# Patient Record
Sex: Female | Born: 1965 | Race: White | Hispanic: No | Marital: Married | Smoking: Never smoker
Health system: Southern US, Community
[De-identification: ages and names within clinical notes are randomized; demographics above are authoritative.]

## PROBLEM LIST (undated history)

## (undated) HISTORY — PX: SHOULDER SURGERY: SHX246

---

## 2004-06-12 ENCOUNTER — Other Ambulatory Visit: Admission: RE | Admit: 2004-06-12 | Discharge: 2004-06-12 | Payer: Self-pay | Admitting: Gynecology

## 2005-06-23 ENCOUNTER — Other Ambulatory Visit: Admission: RE | Admit: 2005-06-23 | Discharge: 2005-06-23 | Payer: Self-pay | Admitting: Gynecology

## 2006-07-14 ENCOUNTER — Ambulatory Visit (HOSPITAL_COMMUNITY): Admission: RE | Admit: 2006-07-14 | Discharge: 2006-07-14 | Payer: Self-pay | Admitting: Gynecology

## 2006-07-16 ENCOUNTER — Other Ambulatory Visit: Admission: RE | Admit: 2006-07-16 | Discharge: 2006-07-16 | Payer: Self-pay | Admitting: Gynecology

## 2015-11-02 ENCOUNTER — Other Ambulatory Visit: Payer: Self-pay | Admitting: Obstetrics and Gynecology

## 2015-11-02 DIAGNOSIS — Z1231 Encounter for screening mammogram for malignant neoplasm of breast: Secondary | ICD-10-CM

## 2015-11-26 ENCOUNTER — Ambulatory Visit
Admission: RE | Admit: 2015-11-26 | Discharge: 2015-11-26 | Disposition: A | Discharge status: Home / Self Care | Payer: BLUE CROSS/BLUE SHIELD | Source: Ambulatory Visit | Attending: Obstetrics and Gynecology | Admitting: Obstetrics and Gynecology

## 2015-11-26 ENCOUNTER — Other Ambulatory Visit: Payer: Self-pay | Admitting: Obstetrics and Gynecology

## 2015-11-26 DIAGNOSIS — Z1231 Encounter for screening mammogram for malignant neoplasm of breast: Secondary | ICD-10-CM | POA: Diagnosis not present

## 2016-11-03 ENCOUNTER — Other Ambulatory Visit: Payer: Self-pay | Admitting: Obstetrics and Gynecology

## 2016-11-03 DIAGNOSIS — Z1239 Encounter for other screening for malignant neoplasm of breast: Secondary | ICD-10-CM

## 2017-11-02 ENCOUNTER — Other Ambulatory Visit: Payer: Self-pay | Admitting: Internal Medicine

## 2017-11-02 ENCOUNTER — Ambulatory Visit
Admission: RE | Admit: 2017-11-02 | Discharge: 2017-11-02 | Disposition: A | Discharge status: Home / Self Care | Payer: BLUE CROSS/BLUE SHIELD | Source: Ambulatory Visit | Attending: Internal Medicine | Admitting: Internal Medicine

## 2017-11-02 DIAGNOSIS — Z1231 Encounter for screening mammogram for malignant neoplasm of breast: Secondary | ICD-10-CM | POA: Diagnosis not present

## 2018-10-26 ENCOUNTER — Other Ambulatory Visit: Payer: Self-pay | Admitting: Physician Assistant

## 2018-10-26 DIAGNOSIS — Z1231 Encounter for screening mammogram for malignant neoplasm of breast: Secondary | ICD-10-CM

## 2018-12-01 ENCOUNTER — Ambulatory Visit
Admission: RE | Admit: 2018-12-01 | Discharge: 2018-12-01 | Disposition: A | Discharge status: Home / Self Care | Payer: Managed Care, Other (non HMO) | Source: Ambulatory Visit | Attending: Physician Assistant | Admitting: Physician Assistant

## 2018-12-01 DIAGNOSIS — Z1231 Encounter for screening mammogram for malignant neoplasm of breast: Secondary | ICD-10-CM | POA: Diagnosis not present

## 2021-05-09 DIAGNOSIS — Z79899 Other long term (current) drug therapy: Secondary | ICD-10-CM | POA: Diagnosis not present

## 2021-05-09 DIAGNOSIS — E7800 Pure hypercholesterolemia, unspecified: Secondary | ICD-10-CM | POA: Diagnosis not present

## 2021-05-09 DIAGNOSIS — Z0000 Encounter for general adult medical examination without abnormal findings: Secondary | ICD-10-CM | POA: Diagnosis not present

## 2021-05-24 ENCOUNTER — Other Ambulatory Visit (HOSPITAL_COMMUNITY): Payer: Self-pay | Admitting: Family Medicine

## 2021-05-29 ENCOUNTER — Other Ambulatory Visit: Payer: Self-pay

## 2021-05-29 ENCOUNTER — Ambulatory Visit (HOSPITAL_COMMUNITY)
Admission: RE | Admit: 2021-05-29 | Discharge: 2021-05-29 | Disposition: A | Discharge status: Home / Self Care | Payer: BLUE CROSS/BLUE SHIELD | Source: Ambulatory Visit | Attending: Family Medicine | Admitting: Family Medicine

## 2021-05-29 ENCOUNTER — Encounter (HOSPITAL_COMMUNITY): Payer: Self-pay

## 2021-05-29 DIAGNOSIS — I700 Atherosclerosis of aorta: Secondary | ICD-10-CM | POA: Insufficient documentation

## 2021-05-29 DIAGNOSIS — J8410 Pulmonary fibrosis, unspecified: Secondary | ICD-10-CM | POA: Insufficient documentation

## 2021-05-29 DIAGNOSIS — Z136 Encounter for screening for cardiovascular disorders: Secondary | ICD-10-CM | POA: Insufficient documentation

## 2021-06-26 ENCOUNTER — Encounter: Payer: Self-pay | Admitting: Cardiovascular Disease

## 2021-06-26 ENCOUNTER — Ambulatory Visit (INDEPENDENT_AMBULATORY_CARE_PROVIDER_SITE_OTHER): Payer: BC Managed Care – PPO | Admitting: Cardiovascular Disease

## 2021-06-26 ENCOUNTER — Other Ambulatory Visit: Payer: Self-pay

## 2021-06-26 VITALS — BP 118/82 | HR 66 | Ht 61.5 in | Wt 174.6 lb

## 2021-06-26 DIAGNOSIS — R931 Abnormal findings on diagnostic imaging of heart and coronary circulation: Secondary | ICD-10-CM | POA: Diagnosis not present

## 2021-06-26 NOTE — Progress Notes (Signed)
?Cardiology Office Note:   ? ?Date:  06/27/2021  ? ?ID:  Laurie Mcclure, DOB 1965/05/23, MRN [id number] ? ?PCP:  Patrice Paradise, MD ?  ?[facility] Providers ?Cardiologist:  None    ? ?Referring MD: Shon Hale, *  ? ?Chief Complaint  ?Patient presents with  ? Hyperlipidemia  ?Laurie Mcclure is a 56 y.o. female who is being seen today for the evaluation of elevated coronary calcium score at the request of Shon Hale, *. ? ? ?History of Present Illness:   ? ?Laurie Mcclure is a 56 y.o. female with a hx of excellent health, familial hypercholesterolemia, who recently underwent a coronary calcium score that was markedly elevated (113, 95th percentile for age and gender). ? 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 ?Laurie Mcclure has no history of cardiac illness.  Laurie Mcclure is physically fit and is able to walk up steep hills without shortness of breath or dyspnea (Laurie Mcclure visited [place] in [place] last December).  Laurie Mcclure does not have orthopnea, PND, claudication, edema, focal neurological complaints, palpitations or syncope. ? ?Laurie Mcclure has never smoked.  Laurie Mcclure does not have diabetes mellitus or hypertension.  Her mother has hypercholesterolemia and Alzheimer's disease but has not had coronary complications by the age of 49. ? ?History reviewed. No pertinent past medical history. ? ?History reviewed. No pertinent surgical history. ? ?Current Medications: ?Current Meds  ?Medication Sig  ? atorvastatin (LIPITOR) 20 MG tablet Take 20 mg by mouth daily.  ? ibuprofen (ADVIL) 200 MG tablet Take 200 mg by mouth as needed.  ?  ? ?Allergies:   Patient has no known allergies.  ? ?Social History  ? ?Socioeconomic History  ? Marital status: Married  ?  Spouse name: Not on file  ? Number of children: Not on file  ? Years of education: Not on file  ? Highest education level: Not on file  ?Occupational History  ? Not on file  ?Tobacco Use  ? Smoking status: Never  ? Smokeless tobacco: Never   ?Substance and Sexual Activity  ? Alcohol use: Not on file  ? Drug use: Not on file  ? Sexual activity: Not on file  ?Other Topics Concern  ? Not on file  ?Social History Narrative  ? Not on file  ? ?Social Determinants of Health  ? ?Financial Resource Strain: Not on file  ?Food Insecurity: Not on file  ?Transportation Needs: Not on file  ?Physical Activity: Not on file  ?Stress: Not on file  ?Social Connections: Not on file  ?  ? ?Family History: ?The patient's mother has hypercholesterolemia and Alzheimer's disease ? ?ROS:   ?Please see the history of present illness.    ? All other systems reviewed and are negative. ? ?EKGs/Labs/Other Studies Reviewed:   ? ?The following studies were reviewed today: ?Coronary calcium score ? ?EKG:  EKG is  ordered today.  The ekg ordered today demonstrates normal sinus rhythm, normal tracing ? ?Recent Labs: ?No results found for requested labs within last 8760 hours.  ?05/09/2021 ?Hemoglobin 12.9, creatinine 0.82, potassium 4.4, ALT 12 ?Recent Lipid Panel ?No results found for: CHOL, TRIG, HDL, CHOLHDL, VLDL, LDLCALC, LDLDIRECT ?05/09/2021 ?Cholesterol 273, HDL 72, LDL 182, triglycerides 110 ? ?Risk Assessment/Calculations:   ?  ? ?    ? ?Physical Exam:   ? ?VS:  BP 118/82   Pulse 66   Ht 5' 1.5" (1.562 m)   Wt 174 lb 9.6  oz (79.2 kg)   SpO2 99%   BMI 32.46 kg/m?    ? ?Wt Readings from Last 3 Encounters:  ?06/26/21 174 lb 9.6 oz (79.2 kg)  ?  ? ?GEN:  Well nourished, well developed in no acute distress ?HEENT: Normal ?NECK: No JVD; No carotid bruits ?LYMPHATICS: No lymphadenopathy ?CARDIAC: RRR, no murmurs, rubs, gallops ?RESPIRATORY:  Clear to auscultation without rales, wheezing or rhonchi  ?ABDOMEN: Soft, non-tender, non-distended ?MUSCULOSKELETAL:  No edema; No deformity  ?SKIN: Warm and dry ?NEUROLOGIC:  Alert and oriented x 3 ?PSYCHIATRIC:  Normal affect  ? ?ASSESSMENT:   ? ?1. Elevated coronary artery calcium score   ? ?PLAN:   ? ?In order of problems listed  above: ? ?Elevated coronary calcium score: Recommend a plain treadmill stress test to exclude functionally significant obstructive lesions. ?Hypercholesterolemia: Strongly suspect that Laurie Mcclure has familial heterozygous hypercholesterolemia.  Laurie Mcclure has been reluctant to take a statin but based on the calcium score I think Laurie Mcclure should (her 10-year MESA risk is only 1.9% based on conventional risk factors, but increases to 4.8% when adding the calcium score).  Laurie Mcclure has already received a prescription for atorvastatin 20 mg daily from Dr. Chanetta Marshallimberlake.  I think this is a great choice and should bring her LDL cholesterol down by about 50% which will make it less than 100 (I think that is a reasonable target). ? ?   ? ?Shared Decision Making/Informed Consent ?The risks [chest pain, shortness of breath, cardiac arrhythmias, dizziness, blood pressure fluctuations, myocardial infarction, stroke/transient ischemic attack, and life-threatening complications (estimated to be 1 in 10,000)], benefits (risk stratification, diagnosing coronary artery disease, treatment guidance) and alternatives of an exercise tolerance test were discussed in detail with Laurie Mcclure and Laurie Mcclure agrees to proceed.  ? ? ?Medication Adjustments/Labs and Tests Ordered: ?Current medicines are reviewed at length with the patient today.  Concerns regarding medicines are outlined above.  ?Orders Placed This Encounter  ?Procedures  ? Exercise Tolerance Test  ? EKG 12-Lead  ? ?No orders of the defined types were placed in this encounter. ? ? ?Patient Instructions  ?Medication Instructions:  ?No changes ?*If you need a refill on your cardiac medications before your next appointment, please call your pharmacy* ? ? ?Lab Work: ?None ordered ?If you have labs (blood work) drawn today and your tests are completely normal, you will receive your results only by: ?MyChart Message (if you have MyChart) OR ?A paper copy in the mail ?If you have any lab test that is abnormal or we  need to change your treatment, we will call you to review the results. ? ? ?Testing/Procedures: ?Your physician has requested that you have an exercise tolerance test. For further information please visit [email or web address]. Please also follow instruction sheet, as given. This will take place at [address], [address]. ?Do not drink or eat foods with caffeine for 24 hours before the test. (Chocolate, coffee, tea, or energy drinks) ?If you use an inhaler, bring it with you to the test. ?Do not smoke for 4 hours before the test. ?Wear comfortable shoes and clothing. ? ?Follow-Up: ?At [facility], you and your health needs are our priority.  As part of our continuing mission to provide you with exceptional heart care, we have created designated Provider Care Teams.  These Care Teams include your primary Cardiologist (physician) and Advanced Practice Providers (APPs -  Physician Assistants and Nurse Practitioners) who all work together to provide you with the care you need, when you need  it. ? ?We recommend signing up for the patient portal called "MyChart".  Sign up information is provided on this After Visit Summary.  MyChart is used to connect with patients for Virtual Visits (Telemedicine).  Patients are able to view lab/test results, encounter notes, upcoming appointments, etc.  Non-urgent messages can be sent to your provider as well.   ?To learn more about what you can do with MyChart, go to [email or web address].   ? ?Your next appointment:   ?Follow up as needed with Dr. Royann Shivers  ? ?Signed, ?Thurmon Fair, MD  ?06/27/2021 1:15 PM    ?[facility] ? ?

## 2021-06-26 NOTE — Patient Instructions (Signed)
Medication Instructions:  No changes *If you need a refill on your cardiac medications before your next appointment, please call your pharmacy*   Lab Work: None ordered If you have labs (blood work) drawn today and your tests are completely normal, you will receive your results only by: MyChart Message (if you have MyChart) OR A paper copy in the mail If you have any lab test that is abnormal or we need to change your treatment, we will call you to review the results.   Testing/Procedures: Your physician has requested that you have an exercise tolerance test. For further information please visit [email or web address]. Please also follow instruction sheet, as given. This will take place at [address], [address]. Do not drink or eat foods with caffeine for 24 hours before the test. (Chocolate, coffee, tea, or energy drinks) If you use an inhaler, bring it with you to the test. Do not smoke for 4 hours before the test. Wear comfortable shoes and clothing.   Follow-Up: At [facility], you and your health needs are our priority.  As part of our continuing mission to provide you with exceptional heart care, we have created designated Provider Care Teams.  These Care Teams include your primary Cardiologist (physician) and Advanced Practice Providers (APPs -  Physician Assistants and Nurse Practitioners) who all work together to provide you with the care you need, when you need it.  We recommend signing up for the patient portal called "MyChart".  Sign up information is provided on this After Visit Summary.  MyChart is used to connect with patients for Virtual Visits (Telemedicine).  Patients are able to view lab/test results, encounter notes, upcoming appointments, etc.  Non-urgent messages can be sent to your provider as well.   To learn more about what you can do with MyChart, go to [email or web address].    Your next appointment:   Follow up as needed with Dr. Croitoru  

## 2021-06-27 ENCOUNTER — Encounter: Payer: Self-pay | Admitting: Cardiovascular Disease

## 2021-07-16 ENCOUNTER — Encounter (HOSPITAL_COMMUNITY): Payer: Managed Care, Other (non HMO)

## 2021-07-24 ENCOUNTER — Telehealth (HOSPITAL_COMMUNITY): Payer: Self-pay | Admitting: *Deleted

## 2021-07-24 NOTE — Telephone Encounter (Signed)
Close encounter 

## 2021-07-25 ENCOUNTER — Ambulatory Visit (HOSPITAL_COMMUNITY)
Admission: RE | Admit: 2021-07-25 | Discharge: 2021-07-25 | Disposition: A | Discharge status: Home / Self Care | Payer: BC Managed Care – PPO | Source: Ambulatory Visit | Attending: Cardiology | Admitting: Cardiology

## 2021-07-25 DIAGNOSIS — R931 Abnormal findings on diagnostic imaging of heart and coronary circulation: Secondary | ICD-10-CM | POA: Insufficient documentation

## 2021-07-25 LAB — EXERCISE TOLERANCE TEST
Angina Index: 0
Base ST Depression (mm): 0 mm
Duke Treadmill Score: 9
Estimated workload: 10.7
Exercise duration (min): 9 min
Exercise duration (sec): 23 s
MPHR: 165 {beats}/min
Peak HR: 169 {beats}/min
Percent HR: 102 %
Rest HR: 68 {beats}/min
ST Depression (mm): 0 mm

## 2021-08-14 DIAGNOSIS — M791 Myalgia, unspecified site: Secondary | ICD-10-CM | POA: Diagnosis not present

## 2021-08-14 DIAGNOSIS — E7800 Pure hypercholesterolemia, unspecified: Secondary | ICD-10-CM | POA: Diagnosis not present

## 2021-08-15 ENCOUNTER — Ambulatory Visit
Admission: EM | Admit: 2021-08-15 | Discharge: 2021-08-15 | Disposition: A | Discharge status: Home / Self Care | Payer: BC Managed Care – PPO | Attending: Emergency Medicine | Admitting: Emergency Medicine

## 2021-08-15 ENCOUNTER — Encounter (HOSPITAL_BASED_OUTPATIENT_CLINIC_OR_DEPARTMENT_OTHER): Payer: Self-pay

## 2021-08-15 ENCOUNTER — Ambulatory Visit (INDEPENDENT_AMBULATORY_CARE_PROVIDER_SITE_OTHER): Payer: BC Managed Care – PPO

## 2021-08-15 ENCOUNTER — Other Ambulatory Visit: Payer: Self-pay

## 2021-08-15 ENCOUNTER — Emergency Department (HOSPITAL_BASED_OUTPATIENT_CLINIC_OR_DEPARTMENT_OTHER)
Admission: EM | Admit: 2021-08-15 | Discharge: 2021-08-15 | Disposition: A | Discharge status: Home / Self Care | Payer: BC Managed Care – PPO | Attending: Emergency Medicine | Admitting: Emergency Medicine

## 2021-08-15 DIAGNOSIS — S0121XA Laceration without foreign body of nose, initial encounter: Secondary | ICD-10-CM | POA: Diagnosis not present

## 2021-08-15 DIAGNOSIS — M79644 Pain in right finger(s): Secondary | ICD-10-CM | POA: Diagnosis not present

## 2021-08-15 DIAGNOSIS — Y9373 Activity, racquet and hand sports: Secondary | ICD-10-CM | POA: Diagnosis not present

## 2021-08-15 DIAGNOSIS — W01198A Fall on same level from slipping, tripping and stumbling with subsequent striking against other object, initial encounter: Secondary | ICD-10-CM | POA: Insufficient documentation

## 2021-08-15 DIAGNOSIS — S0181XA Laceration without foreign body of other part of head, initial encounter: Secondary | ICD-10-CM

## 2021-08-15 DIAGNOSIS — R040 Epistaxis: Secondary | ICD-10-CM | POA: Diagnosis not present

## 2021-08-15 DIAGNOSIS — S0992XA Unspecified injury of nose, initial encounter: Secondary | ICD-10-CM | POA: Diagnosis not present

## 2021-08-15 DIAGNOSIS — S80211A Abrasion, right knee, initial encounter: Secondary | ICD-10-CM

## 2021-08-15 DIAGNOSIS — S62634A Displaced fracture of distal phalanx of right ring finger, initial encounter for closed fracture: Secondary | ICD-10-CM

## 2021-08-15 DIAGNOSIS — M25561 Pain in right knee: Secondary | ICD-10-CM | POA: Diagnosis not present

## 2021-08-15 DIAGNOSIS — S8991XA Unspecified injury of right lower leg, initial encounter: Secondary | ICD-10-CM

## 2021-08-15 DIAGNOSIS — W19XXXA Unspecified fall, initial encounter: Secondary | ICD-10-CM | POA: Diagnosis not present

## 2021-08-15 MED ORDER — LIDOCAINE HCL 2 % IJ SOLN
10.0000 mL | Freq: Once | INTRAMUSCULAR | Status: AC
  Administered 2021-08-15: 200 mg via INTRADERMAL
  Filled 2021-08-15: qty 20

## 2021-08-15 NOTE — Discharge Instructions (Addendum)
[facility] for further evaluation and treatment of your right ring finger. ? ?The Steri-Strips I have applied to your face have helped approximate your wound best we can here in the clinic. ? ?Please reach out to [facility] plastic surgery specialists on [facility].  I provided you with their contact information.  It is important that the laceration on your face is sewed up by a plastic specialist for best results.  This needs to be done within the next 48 hours. ? ?Please continue to monitor your knee for stability, increased swelling, pain.  You may require imaging in the next 48 to 72 hours depending on how it is feeling.  At this time, please continue activities as tolerated and rest your knee is much as you can.  Please continue to apply ice to your knee as well. ? ?Please follow-up with your primary care provider regarding the injury to your nose.  Based on my physical exam findings, I do not believe that you have suffered any bony injuries to your face but if the bleeding from inside your nose does not resolve in the next 6 to 12 hours, I recommend that you go to the emergency room as you may require CT scan to further rule out bony injury. ? ?Thank you for visiting urgent care today. ?

## 2021-08-15 NOTE — ED Notes (Signed)
RN provided AVS using Teachback Method. Patient verbalizes understanding of Discharge Instructions. Opportunity for Questioning and Answers were provided by RN. Patient Discharged from ED ambulatory to Home with Family. ? ?

## 2021-08-15 NOTE — ED Triage Notes (Signed)
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 Patient states she fell forward and now has an abrasion to her nose, right knee, bruising to bilateral knees, and states she might have broken her right 4th finger. Patient denies SOB or vision changes at this time.  ?

## 2021-08-15 NOTE — ED Notes (Signed)
PA at the Bedside. ?
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
## 2021-08-15 NOTE — ED Provider Notes (Signed)
?[facility] EMERGENCY DEPT ?Provider Note ? ? ?CSN: [id number] ?Arrival date & time: 08/15/21  Paulo Fruit ? ?  ? ?History ? ?Chief Complaint  ?Patient presents with  ? Facial Injury  ? ? ?Laurie Mcclure is a 56 y.o. female who presents to the ED for evaluation of a yeast laceration on the nasal bridge that occurred while playing pickle ball earlier today.  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  She initially went to urgent care who referred her to [facility] for her finger and knee injury.  She also recommended that she have the laceration on her nasal bridge repaired by plastic surgeon, however patient spoke with eye doctor that she is familiar with who recommended that she get it repaired sooner rather than later.  Bleeding well controlled prior to evaluation.  Patient denies headache, confusion, nausea, vomiting. ? ?Facial Injury ? ?  ? ?Home Medications ?Prior to Admission medications   ?Medication Sig Start Date End Date Taking? Authorizing Provider  ?atorvastatin (LIPITOR) 20 MG tablet Take 20 mg by mouth daily. 06/05/21   [provider]  ?ibuprofen (ADVIL) 200 MG tablet Take 200 mg by mouth as needed.    [provider]  ?   ? ?Allergies    ?Patient has no known allergies.   ? ?Review of Systems   ?Review of Systems ? ?Physical Exam ?Updated Vital Signs ?BP (!) 156/76 (BP Location: Right Arm)   Pulse 72   Temp 98.3 ?F (36.8 ?C)   Resp 18   Ht 5\' 1"  (1.549 m)   Wt 77.1 kg   SpO2 100%   BMI 32.12 kg/m?  ?Physical Exam ?Vitals and nursing note reviewed.  ?Constitutional:   ?   General: She is not in acute distress. ?   Appearance: She is not ill-appearing.  ?HENT:  ?   Head: Atraumatic.  ?   Comments: Able to breathe through her nose ?Eyes:  ?   Conjunctiva/sclera: Conjunctivae normal.  ?   Comments: Bruising of the left medial nasal bridge  ?Cardiovascular:  ?   Rate and Rhythm: Normal rate and  regular rhythm.  ?   Pulses: Normal pulses.  ?   Heart sounds: No murmur heard. ?Pulmonary:  ?   Effort: Pulmonary effort is normal. No respiratory distress.  ?   Breath sounds: Normal breath sounds.  ?Abdominal:  ?   General: Abdomen is flat. There is no distension.  ?   Palpations: Abdomen is soft.  ?   Tenderness: There is no abdominal tenderness.  ?Musculoskeletal:     ?   General: Normal range of motion.  ?   Cervical back: Normal range of motion.  ?Skin: ?   General: Skin is warm and dry.  ?   Capillary Refill: Capillary refill takes less than 2 seconds.  ?   Comments: Small less than 1 cm U-shaped laceration of nasal bridge  ?Neurological:  ?   General: No focal deficit present.  ?   Mental Status: She is alert.  ?Psychiatric:     ?   Mood and Affect: Mood normal.  ? ? ?ED Results / Procedures / Treatments   ?Labs ?(all labs ordered are listed, but only abnormal results are displayed) ?Labs Reviewed - No data to display ? ?EKG ?None ? ?Radiology ?DG Hand Complete Right ? ?Result Date: 08/15/2021 ?CLINICAL DATA:  Fall, right fourth digit pain EXAM: RIGHT HAND -  COMPLETE 3+ VIEW COMPARISON:  None Available. FINDINGS: Displaced intra-articular corner type fracture of the dorsal right fourth distal phalanx. No other fracture or dislocation of the right hand. Joint spaces are well preserved. IMPRESSION: Displaced intra-articular corner type fracture of the dorsal right fourth distal phalanx. No other fracture or dislocation of the right hand. Electronically Signed   By: Jearld LeschAlex D Bibbey M.D.   On: 08/15/2021 15:17   ? ?Procedures ?Marland Kitchen..Laceration Repair ? ?Date/Time: 08/15/2021 10:42 PM ?Performed by: Janell Quietonklin, Tom Macpherson R, PA-C ?Authorized by: Janell Quietonklin, Charmelle Soh R, PA-C  ? ?Consent:  ?  Consent obtained:  Verbal ?  Consent given by:  Patient ?  Risks discussed:  Infection, need for additional repair, pain, poor cosmetic result and poor wound healing ?  Alternatives discussed:  No treatment and delayed treatment ?Universal  protocol:  ?  Procedure explained and questions answered to patient or proxy's satisfaction: yes   ?  Relevant documents present and verified: yes   ?  Test results available: yes   ?  Imaging studies available: yes   ?  Required blood products, implants, devices, and special equipment available: yes   ?  Site/side marked: yes   ?  Immediately prior to procedure, a time out was called: yes   ?  Patient identity confirmed:  Verbally with patient ?Anesthesia:  ?  Anesthesia method:  Local infiltration ?  Local anesthetic:  Lidocaine 2% w/o epi ?Laceration details:  ?  Location:  Face ?  Face location:  Nose ?  Length (cm):  1 ?  Depth (mm):  1 ?Exploration:  ?  Hemostasis achieved with:  Direct pressure ?  Imaging outcome: foreign body noted   ?Treatment:  ?  Area cleansed with:  Povidone-iodine ?  Amount of cleaning:  Standard ?  Irrigation solution:  Sterile saline ?  Irrigation volume:  250ml ?  Irrigation method:  Pressure wash ?  Visualized foreign bodies/material removed: no   ?  Debridement:  None ?  Undermining:  None ?  Scar revision: no   ?Skin repair:  ?  Repair method:  Sutures ?  Suture size:  6-0 ?  Suture material:  Prolene ?  Suture technique:  Simple interrupted ?  Number of sutures:  3 ?Approximation:  ?  Approximation:  Close ?Repair type:  ?  Repair type:  Simple ?Post-procedure details:  ?  Dressing:  Antibiotic ointment and adhesive bandage ?  Procedure completion:  Tolerated  ? ? ?Medications Ordered in ED ?Medications  ?lidocaine (XYLOCAINE) 2 % (with pres) injection 200 mg (200 mg Intradermal Given 08/15/21 1949)  ? ? ?ED Course/ Medical Decision Making/ A&P ?  ?                        ?Medical Decision Making ?Risk ?Prescription drug management. ? ? ?56 year old female in no acute distress, nontoxic-appearing presents to the ED for evaluation of a nasal bridge laceration.  On exam, patient has small, less than 1 cm U-shaped laceration to the nasal bridge.  [facility].  Vitals without acute abnormalities.Pressure irrigation performed. Wound explored and base of wound visualized in a bloodless field without evidence of foreign body.  Laceration occurred < 8 hours prior to repair which was well tolerated. t has  no comorbidities to effect normal wound healing. Pt discharged  without antibiotics.  Discussed suture home care with patient and answered questions. Pt to follow-up for  wound check and suture removal in 7 days; they are to return to the ED sooner for signs of infection. Pt is hemodynamically stable with no complaints prior to dc.  ? ? ?Final Clinical Impression(s) / ED Diagnoses ?Final diagnoses:  ?Facial laceration, initial encounter  ? ? ?Rx / DC Orders ?ED Discharge Orders   ? ? None  ? ?  ? ? ?  ?Janell Quiet, [place] ?08/15/21 2244 ? ?  ?Virgina Norfolk, DO ?08/15/21 2315 ? ?

## 2021-08-15 NOTE — Discharge Instructions (Addendum)
1. Medications: Tylenol or ibuprofen for pain, usual home medications 2. Treatment: ice for swelling, keep wound clean with warm soap and water and keep bandage dry, do not submerge in water for 24 hours 3. Follow Up: Please return or follow up with primary care provider in 7 days to have your stitches/staples removed or sooner if you have concerns. Return to the emergency department for increased redness, drainage of pus from the wound, or fevers/chills.   WOUND CARE  Keep area clean and dry for 24 hours. Do not remove bandage, if applied.  After 24 hours, remove bandage and wash wound gently with mild soap and warm water. Reapply a new bandage after cleaning wound, if directed.   Continue daily cleansing with soap and water until stitches/staples are removed.  Do not apply any ointments or creams to the wound while stitches/staples are in place, as this may cause delayed healing. Return if you experience any of the following signs of infection: Swelling, redness, pus drainage, streaking, fever >101.0 F  Return if you experience excessive bleeding that does not stop after 15-20 minutes of constant, firm pressure.  

## 2021-08-15 NOTE — ED Triage Notes (Signed)
Pt presents with injuries from falling forward during a pickle ball game today. Pt has already been evaluated at [facility] and ortho. Pt sent here to have sutures placed in a nose lac.  ?

## 2021-08-16 ENCOUNTER — Other Ambulatory Visit: Payer: Self-pay | Admitting: Medical

## 2021-08-16 DIAGNOSIS — M25561 Pain in right knee: Secondary | ICD-10-CM

## 2021-08-16 DIAGNOSIS — M20011 Mallet finger of right finger(s): Secondary | ICD-10-CM | POA: Diagnosis not present

## 2021-09-05 DIAGNOSIS — M79644 Pain in right finger(s): Secondary | ICD-10-CM | POA: Diagnosis not present

## 2021-09-05 DIAGNOSIS — M20011 Mallet finger of right finger(s): Secondary | ICD-10-CM | POA: Diagnosis not present

## 2021-09-26 DIAGNOSIS — M25561 Pain in right knee: Secondary | ICD-10-CM | POA: Diagnosis not present

## 2021-09-26 DIAGNOSIS — M20011 Mallet finger of right finger(s): Secondary | ICD-10-CM | POA: Diagnosis not present

## 2021-09-27 DIAGNOSIS — M20011 Mallet finger of right finger(s): Secondary | ICD-10-CM | POA: Diagnosis not present

## 2021-10-10 DIAGNOSIS — E7800 Pure hypercholesterolemia, unspecified: Secondary | ICD-10-CM | POA: Diagnosis not present

## 2021-10-16 DIAGNOSIS — M25561 Pain in right knee: Secondary | ICD-10-CM | POA: Diagnosis not present

## 2021-10-31 DIAGNOSIS — R945 Abnormal results of liver function studies: Secondary | ICD-10-CM | POA: Diagnosis not present

## 2021-11-11 DIAGNOSIS — M20011 Mallet finger of right finger(s): Secondary | ICD-10-CM | POA: Diagnosis not present

## 2021-11-28 DIAGNOSIS — M25561 Pain in right knee: Secondary | ICD-10-CM | POA: Diagnosis not present

## 2021-12-26 DIAGNOSIS — M7989 Other specified soft tissue disorders: Secondary | ICD-10-CM | POA: Diagnosis not present

## 2022-01-02 ENCOUNTER — Other Ambulatory Visit: Payer: Self-pay | Admitting: Surgery

## 2022-01-02 DIAGNOSIS — M7989 Other specified soft tissue disorders: Secondary | ICD-10-CM

## 2022-01-03 ENCOUNTER — Ambulatory Visit
Admission: RE | Admit: 2022-01-03 | Discharge: 2022-01-03 | Disposition: A | Discharge status: Home / Self Care | Payer: BC Managed Care – PPO | Source: Ambulatory Visit | Attending: Surgery | Admitting: Surgery

## 2022-01-03 DIAGNOSIS — M7989 Other specified soft tissue disorders: Secondary | ICD-10-CM

## 2022-01-03 DIAGNOSIS — R221 Localized swelling, mass and lump, neck: Secondary | ICD-10-CM | POA: Diagnosis not present

## 2022-01-07 NOTE — Progress Notes (Signed)
USN shows normal thyroid without concern.  No need for further thyroid evaluation.  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  Plan excision as outpatient surgery later this month.  Armandina Gemma, MD [facility] [place] [facility] Office: [phone]

## 2022-01-27 DIAGNOSIS — D210 Benign neoplasm of connective and other soft tissue of head, face and neck: Secondary | ICD-10-CM | POA: Diagnosis not present

## 2022-01-27 DIAGNOSIS — D170 Benign lipomatous neoplasm of skin and subcutaneous tissue of head, face and neck: Secondary | ICD-10-CM | POA: Diagnosis not present

## 2022-01-27 DIAGNOSIS — R222 Localized swelling, mass and lump, trunk: Secondary | ICD-10-CM | POA: Diagnosis not present

## 2022-05-15 DIAGNOSIS — Z79899 Other long term (current) drug therapy: Secondary | ICD-10-CM | POA: Diagnosis not present

## 2022-05-15 DIAGNOSIS — Z0000 Encounter for general adult medical examination without abnormal findings: Secondary | ICD-10-CM | POA: Diagnosis not present

## 2022-05-15 DIAGNOSIS — E7800 Pure hypercholesterolemia, unspecified: Secondary | ICD-10-CM | POA: Diagnosis not present

## 2022-05-22 ENCOUNTER — Telehealth: Payer: Self-pay | Admitting: Cardiovascular Disease

## 2022-05-22 DIAGNOSIS — R931 Abnormal findings on diagnostic imaging of heart and coronary circulation: Secondary | ICD-10-CM

## 2022-05-22 NOTE — Telephone Encounter (Signed)
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 We need updated labs, drawn off therapy (stop statin and draw fasting lipids in a month). Then can apply for one of those meds.  LDL was 182 in the past. Ca score is high for age, but only 55, so not CAD equivalent.

## 2022-05-22 NOTE — Telephone Encounter (Signed)
Returned call to patient who states that she had previously tried Atorvastatin in the past that resulted in muscle aches so her primary care switched her to Rosuvastatin now and she states that she is still having muscle aches. Patient was told by PCP to check with cards about possible medications for cholesterol. Advised patient I would forward to Dr. Loletha Grayer for him to review and advise. Medication list updated to reflect changes per patient.

## 2022-05-22 NOTE — Telephone Encounter (Signed)
Pt c/o medication issue:  1. Name of Medication:  Rosuvastatin  2. How are you currently taking this medication (dosage and times per day)?   As prescribed  3. Are you having a reaction (difficulty breathing--STAT)?   4. What is your medication issue?   Patient stated she has been taking Rosuvastatin and she would like to change this medication as she is having some side effects.  Patient would like to try something other than a statin which causes aching in her joints.

## 2022-05-23 NOTE — Telephone Encounter (Signed)
Returned call to patient and advised her of the below from Dr. Sallyanne Kuster- patient will stop her rosuvastatin and return in 1 month for repeat labs. Lab orders placed. Advised patient to call back to office with any issues, questions, or concerns. Patient verbalized understanding.

## 2022-06-18 DIAGNOSIS — R931 Abnormal findings on diagnostic imaging of heart and coronary circulation: Secondary | ICD-10-CM | POA: Diagnosis not present

## 2022-06-18 DIAGNOSIS — E782 Mixed hyperlipidemia: Secondary | ICD-10-CM | POA: Diagnosis not present

## 2022-06-18 LAB — LIPID PANEL
Chol/HDL Ratio: 4.1 ratio (ref 0.0–4.4)
Cholesterol, Total: 281 mg/dL — ABNORMAL HIGH (ref 100–199)
HDL: 68 mg/dL (ref >39)
LDL Chol Calc (NIH): 184 mg/dL — ABNORMAL HIGH (ref 0–99)
Triglycerides: 159 mg/dL — ABNORMAL HIGH (ref 0–149)
VLDL Cholesterol Cal: 29 mg/dL (ref 5–40)

## 2022-06-19 ENCOUNTER — Other Ambulatory Visit (HOSPITAL_COMMUNITY): Payer: Self-pay

## 2022-06-19 ENCOUNTER — Other Ambulatory Visit: Payer: Self-pay | Admitting: Emergency Medicine

## 2022-06-19 ENCOUNTER — Telehealth: Payer: Self-pay

## 2022-06-19 NOTE — Telephone Encounter (Signed)
Please do PA for Repatha 140 mg

## 2022-06-19 NOTE — Telephone Encounter (Signed)
Pharmacy Patient Advocate Encounter   Received notification from [facility] that prior authorization for REPATHA 140 MG/ML INJ is needed.    PA submitted on 06/19/22 Key [id number] Status is pending  Karie Soda, [place] Patient Advocate Specialist Direct Number: [phone] Fax: [phone]

## 2022-06-23 NOTE — Telephone Encounter (Signed)
Pharmacy Patient Advocate Encounter  Received notification from [facility] that the request for prior authorization for REPATHA 140 MG/ML INJ  has been denied due to .   [place], PLEASE ADVISE  Karie Soda, [place] Patient Advocate Specialist Direct Number: [phone] Fax: [phone]

## 2022-06-23 NOTE — Telephone Encounter (Signed)
Laurie Mcclure  It will be the same response.  Her insurance is saying she doesn't have enough ASCVD to meet their criteria for the medication.  Best we might be able to get is Nexletol if he wants to try that (no promises!)

## 2022-06-27 NOTE — Telephone Encounter (Signed)
Please order nexletol 180 mg daily and if approved, repeat lipid profile in 8 weeks

## 2022-06-27 NOTE — Telephone Encounter (Signed)
Patient is calling in for update. Please advise

## 2022-06-27 NOTE — Telephone Encounter (Signed)
Patient would like to try the Nexletol.  She ask to please try to get the PA and if approved she would like more information regarding the medication as she thinks she still has to take another statin with it.

## 2022-06-30 ENCOUNTER — Other Ambulatory Visit: Payer: Self-pay | Admitting: Emergency Medicine

## 2022-06-30 ENCOUNTER — Telehealth: Payer: Self-pay | Admitting: Emergency Medicine

## 2022-06-30 ENCOUNTER — Other Ambulatory Visit (HOSPITAL_COMMUNITY): Payer: Self-pay

## 2022-06-30 DIAGNOSIS — R931 Abnormal findings on diagnostic imaging of heart and coronary circulation: Secondary | ICD-10-CM

## 2022-06-30 MED ORDER — NEXLETOL 180 MG PO TABS: 180.0000 mg | ORAL_TABLET | Freq: Every day | ORAL | 3 refills | Status: DC

## 2022-06-30 NOTE — Telephone Encounter (Signed)
  PER TEST CLAIM NO PA IS NEEDED FOR NEXLETOL.   However she has a high cost. I enrolled her into a copay card that can be provided to any pharmacy (given they are able to process coupons again) it should help significantly with the copay.

## 2022-06-30 NOTE — Telephone Encounter (Signed)
Called patient- told of no PA required for NEXLETOL, but it has a high cost. Sent discount card through [employer]. Instructed patient that she needs to get lipid panel in 8 weeks, can go to any [facility].  She verbalized understanding. Prescription sent to preferred pharmacy.

## 2022-07-02 ENCOUNTER — Telehealth: Payer: Self-pay | Admitting: Cardiovascular Disease

## 2022-07-02 NOTE — Telephone Encounter (Signed)
Did she show the pharmacy to $10 coupon ("loyalty card" that we sent to her through [place]?

## 2022-07-02 NOTE — Telephone Encounter (Signed)
Patient states her medication is $700 and she can not afford that. She would like other options..   Please advise

## 2022-07-02 NOTE — Telephone Encounter (Signed)
Pt c/o medication issue:  1. Name of Medication: Bempedoic Acid (NEXLETOL) 180 MG TABS   2. How are you currently taking this medication (dosage and times per day)?    3. Are you having a reaction (difficulty breathing--STAT)? no  4. What is your medication issue? Patient is unable to afford this medication. Calling to see what other options they are. Please advise

## 2022-07-03 NOTE — Telephone Encounter (Signed)
Unfortunately, since insurance will not cover either PCSK9 inhibitors, Leqvio or Nexletol, the only remaining options are statins and ezetimibe. I know she had problems with rosuvastatin taken daily. We could try intermittent rosuvastatin dosing (10 mg twice a week to start off with) and see where that gets [place]. Another alternative would be to take pravastatin 40 mg at bedtime daily.  This is probably the least likely of the statins to cause musculoskeletal side effects. Finally, ezetimibe will give a slight reduction in LDL cholesterol, at most 15-20% but can be used alone or in combination with a low dose of a statin. To start with the simpler solution, since she already has rosuvastatin at home, I would start with the rosuvastatin 10 mg twice weekly option and check a lipid profile in 2-3 months.

## 2022-07-03 NOTE — Telephone Encounter (Signed)
Patient stated she did give them the coupon card and it didn't do a thing.

## 2022-07-04 MED ORDER — EZETIMIBE 10 MG PO TABS: 10.0000 mg | ORAL_TABLET | Freq: Every day | ORAL | 3 refills | Status: DC

## 2022-07-04 NOTE — Telephone Encounter (Signed)
Pt would like to start Ezetimibe 10mg  daily. Then if needed, add a statin? She has tried both of the statins you mentioned and both gave her problems.  Prescription sent for Ezetimibe She will come for a lipid panel in 2-3 months; orders already placed.

## 2022-09-05 DIAGNOSIS — U071 COVID-19: Secondary | ICD-10-CM | POA: Diagnosis not present

## 2022-09-05 DIAGNOSIS — J029 Acute pharyngitis, unspecified: Secondary | ICD-10-CM | POA: Diagnosis not present

## 2022-10-02 DIAGNOSIS — E7800 Pure hypercholesterolemia, unspecified: Secondary | ICD-10-CM | POA: Diagnosis not present

## 2022-10-02 DIAGNOSIS — R931 Abnormal findings on diagnostic imaging of heart and coronary circulation: Secondary | ICD-10-CM | POA: Diagnosis not present

## 2022-10-03 LAB — LIPID PANEL
Chol/HDL Ratio: 2.9 ratio (ref 0.0–4.4)
Cholesterol, Total: 211 mg/dL — ABNORMAL HIGH (ref 100–199)
HDL: 72 mg/dL (ref >39)
LDL Chol Calc (NIH): 126 mg/dL — ABNORMAL HIGH (ref 0–99)
Triglycerides: 71 mg/dL (ref 0–149)
VLDL Cholesterol Cal: 13 mg/dL (ref 5–40)

## 2022-10-06 NOTE — Progress Notes (Signed)
Please tell her that she has done an exceptional job. She has lowered her LDL with lifestyle changes much better than the average patient.  I would stay the course for now (keep up the good work and take zetia), recheck labs in 6 months, but add the LP(a) to the plain lipid profile. There are ongoing studies with the newer drugs for patients with LP(a). If the studies are positive and the drugs are approved for LP(a) reduction, we will take another crack at getting insurance coverage.

## 2022-10-26 ENCOUNTER — Emergency Department (HOSPITAL_BASED_OUTPATIENT_CLINIC_OR_DEPARTMENT_OTHER)
Admission: EM | Admit: 2022-10-26 | Discharge: 2022-10-26 | Disposition: A | Discharge status: Home / Self Care | Payer: BC Managed Care – PPO | Source: Home / Self Care | Attending: Emergency Medicine | Admitting: Emergency Medicine

## 2022-10-26 ENCOUNTER — Other Ambulatory Visit: Payer: Self-pay

## 2022-10-26 ENCOUNTER — Encounter (HOSPITAL_BASED_OUTPATIENT_CLINIC_OR_DEPARTMENT_OTHER): Payer: Self-pay

## 2022-10-26 DIAGNOSIS — N3091 Cystitis, unspecified with hematuria: Secondary | ICD-10-CM | POA: Diagnosis not present

## 2022-10-26 DIAGNOSIS — R300 Dysuria: Secondary | ICD-10-CM | POA: Diagnosis not present

## 2022-10-26 LAB — URINALYSIS, MICROSCOPIC (REFLEX): RBC / HPF: >50 RBC/hpf (ref 0–5)

## 2022-10-26 LAB — URINALYSIS, ROUTINE W REFLEX MICROSCOPIC

## 2022-10-26 MED ORDER — CEPHALEXIN 500 MG PO CAPS: 500.0000 mg | ORAL_CAPSULE | Freq: Four times a day (QID) | ORAL | 0 refills | Status: AC

## 2022-10-26 MED ORDER — PHENAZOPYRIDINE HCL 200 MG PO TABS: 200.0000 mg | ORAL_TABLET | Freq: Three times a day (TID) | ORAL | 0 refills | Status: AC

## 2022-10-26 NOTE — ED Notes (Signed)
Reviewed AVS/discharge instruction with patient. Time allotted for and all questions answered. Patient is agreeable for d/c and escorted to ed exit by staff.  

## 2022-10-26 NOTE — ED Triage Notes (Signed)
Patient here POV from Home.  Notes Dysuria, Blood in Urine, Frequency that began today. No Known Fevers. No N/V/D.  NAD Noted during Triage. A&Ox4. GCS 15. Ambulatory.

## 2022-10-26 NOTE — Discharge Instructions (Signed)
Your symptoms are suggestive of hemorrhagic cystitis.  Please take antibiotic as prescribed.  Follow-up with your doctor in 1 week for urinary recheck.  A urine culture has been sent if it grows any specific bacteria that needs changing antibiotic we will notify you with appropriate treatment.  Return to ER if your symptoms worsen or if you have other concern.

## 2022-10-26 NOTE — ED Provider Notes (Signed)
[facility] EMERGENCY DEPARTMENT AT [facility] Provider Note   CSN: [id number] Arrival date & time: 10/26/22  1535     History  Chief Complaint  Patient presents with   Dysuria    Laurie Mcclure is a 57 y.o. female.  The history is provided by the patient and medical records. No language interpreter was used.  Dysuria    57 year old female presenting to the ED with complaints of urinary discomfort.  Patient states today when she urinates she noticed quite a bit of blood coming from her urine.  She tried to capture in a cup and noticed that her urine was filled of blood.  She also endorsed burning in urination with increased frequency and urgency.  She does not endorse any fever or chills no nausea vomiting diarrhea no back pain.  She states she has not had any kind of vaginal spotting for at least 2 years.  She denies having fever, unexpected weight changes, or night sweats.    Home Medications Prior to Admission medications   Medication Sig Start Date End Date Taking? Authorizing Provider  Bempedoic Acid (NEXLETOL) 180 MG TABS Take 1 tablet (180 mg total) by mouth daily. 06/30/22   Croitoru, Mihai, MD  ezetimibe (ZETIA) 10 MG tablet Take 1 tablet (10 mg total) by mouth daily. 07/04/22 06/29/23  Croitoru, Mihai, MD  ibuprofen (ADVIL) 200 MG tablet Take 200 mg by mouth as needed.    [provider]      Allergies    Patient has no known allergies.    Review of Systems   Review of Systems  Genitourinary:  Positive for dysuria.  All other systems reviewed and are negative.   Physical Exam Updated Vital Signs BP (!) 163/85 (BP Location: Right Arm)   Pulse 79   Temp 98.4 F (36.9 C) (Oral)   Resp 18   Ht 5\' 2"  (1.575 m)   Wt 77.6 kg   SpO2 97%   BMI 31.28 kg/m  Physical Exam Vitals and nursing note reviewed.  Constitutional:      General: She is not in acute distress.    Appearance: She is well-developed.  HENT:     Head: Atraumatic.  Eyes:      Conjunctiva/sclera: Conjunctivae normal.  Cardiovascular:     Rate and Rhythm: Normal rate and regular rhythm.  Pulmonary:     Effort: Pulmonary effort is normal.  Abdominal:     Palpations: Abdomen is soft.     Tenderness: There is no abdominal tenderness.  Musculoskeletal:     Cervical back: Neck supple.  Skin:    Findings: No rash.  Neurological:     Mental Status: She is alert.  Psychiatric:        Mood and Affect: Mood normal.     ED Results / Procedures / Treatments   Labs (all labs ordered are listed, but only abnormal results are displayed) Labs Reviewed  URINALYSIS, ROUTINE W REFLEX MICROSCOPIC - Abnormal; Notable for the following components:      Result Value   Color, Urine RED (*)    APPearance CLOUDY (*)    Glucose, UA   (*)    Value: TEST NOT REPORTED DUE TO COLOR INTERFERENCE OF URINE PIGMENT   Hgb urine dipstick   (*)    Value: TEST NOT REPORTED DUE TO COLOR INTERFERENCE OF URINE PIGMENT   Bilirubin Urine   (*)    Value: TEST NOT REPORTED DUE TO COLOR INTERFERENCE OF URINE PIGMENT  Ketones, ur   (*)    Value: TEST NOT REPORTED DUE TO COLOR INTERFERENCE OF URINE PIGMENT   Protein, ur   (*)    Value: TEST NOT REPORTED DUE TO COLOR INTERFERENCE OF URINE PIGMENT   Nitrite   (*)    Value: TEST NOT REPORTED DUE TO COLOR INTERFERENCE OF URINE PIGMENT   Leukocytes,Ua   (*)    Value: TEST NOT REPORTED DUE TO COLOR INTERFERENCE OF URINE PIGMENT   All other components within normal limits  URINALYSIS, MICROSCOPIC (REFLEX) - Abnormal; Notable for the following components:   Bacteria, UA FEW (*)    All other components within normal limits    EKG None  Radiology No results found.  Procedures Pelvic exam  Date/Time: 10/26/2022 6:15 PM  Performed by: Fayrene Helper, PA-C Authorized by: Fayrene Helper, PA-C  Comments: Chaperone present during exam.  No inguinal lymphadenopathy noted, normal external genitalia.  No pain with speculum insertion.  Normal vaginal  vault free of lesion or rash.  Close cervical os no vaginal bleeding noted.  No tenderness on bimanual exam.       Medications Ordered in ED Medications - No data to display  ED Course/ Medical Decision Making/ A&P                             Medical Decision Making Amount and/or Complexity of Data Reviewed Labs: ordered.   Blood pressure (!) 163/85, pulse 79, temperature 98.4 F (36.9 C), temperature source Oral, resp. rate 18, height 5\' 2"  (1.575 m), weight 77.6 kg, SpO2 97%.  23:57 PM 57 year old female presenting to the ED with complaints of urinary discomfort.  Patient states today when she urinates she noticed quite a bit of blood coming from her urine.  She tried to capture in a cup and noticed that her urine was filled of blood.  She also endorsed burning in urination with increased frequency and urgency.  She does not endorse any fever or chills no nausea vomiting diarrhea no back pain.  She states she has not had any kind of vaginal spotting for at least 2 years.  She denies having fever, unexpected weight changes, or night sweats.  On exam this is a well-appearing female resting comfortably in bed appears to be in no acute discomfort.  Heart with normal rate and rhythm, lungs clear to auscultation bilaterally abdomen is soft and nontender  -Labs ordered, independently viewed and interpreted by me.  Labs remarkable for UA showing red color, cloudy appearasnce and >50 RBC along with 6-10 WBC and few bacteria -The patient was maintained on a cardiac monitor.  I personally viewed and interpreted the cardiac monitored which showed an underlying rhythm of: NSR -Imaging including abd/pelvis CT considered but pt without abd pain or flank pain therefore doubt emergent abdominal/pelvis pathology -This patient presents to the ED for concern of hematuria, this involves an extensive number of treatment options, and is a complaint that carries with it a high risk of complications and  morbidity.  The differential diagnosis includes hemorrhagic cystitis, UTI, kidney stone, malignancy, vaginal bleeding, runner's bladder -Co morbidities that complicate the patient evaluation includes none -Treatment includes keflex -Reevaluation of the patient after these medicines showed that the patient stayed the same -PCP office notes or outside notes reviewed -Escalation to admission/observation considered: patients feels much better, is comfortable with discharge, and will follow up with PCP -Prescription medication considered, patient comfortable with keflex and pyridium -  Social Determinant of Health considered         Final Clinical Impression(s) / ED Diagnoses Final diagnoses:  Hemorrhagic cystitis    Rx / DC Orders ED Discharge Orders          Ordered    cephALEXin (KEFLEX) 500 MG capsule  4 times daily        10/26/22 1814    phenazopyridine (PYRIDIUM) 200 MG tablet  3 times daily        10/26/22 1818              Fayrene Helper, PA-C 10/26/22 1818    Horton, Clabe Seal, DO 10/26/22 2333

## 2022-10-31 ENCOUNTER — Encounter: Payer: Self-pay | Admitting: Family Medicine

## 2022-10-31 DIAGNOSIS — R319 Hematuria, unspecified: Secondary | ICD-10-CM | POA: Diagnosis not present

## 2022-11-04 ENCOUNTER — Other Ambulatory Visit: Payer: Self-pay | Admitting: Family Medicine

## 2022-11-04 DIAGNOSIS — R319 Hematuria, unspecified: Secondary | ICD-10-CM

## 2022-11-14 ENCOUNTER — Other Ambulatory Visit: Payer: BC Managed Care – PPO

## 2022-11-19 ENCOUNTER — Ambulatory Visit: Admission: RE | Admit: 2022-11-19 | Payer: BC Managed Care – PPO | Source: Ambulatory Visit

## 2022-11-19 ENCOUNTER — Other Ambulatory Visit: Payer: Self-pay | Admitting: Family Medicine

## 2022-11-19 DIAGNOSIS — R310 Gross hematuria: Secondary | ICD-10-CM | POA: Diagnosis not present

## 2022-11-19 DIAGNOSIS — R319 Hematuria, unspecified: Secondary | ICD-10-CM

## 2022-11-19 DIAGNOSIS — I700 Atherosclerosis of aorta: Secondary | ICD-10-CM | POA: Diagnosis not present

## 2022-12-04 ENCOUNTER — Other Ambulatory Visit: Payer: Self-pay | Admitting: Family Medicine

## 2022-12-04 DIAGNOSIS — Z1231 Encounter for screening mammogram for malignant neoplasm of breast: Secondary | ICD-10-CM

## 2022-12-17 NOTE — Progress Notes (Signed)
 Subjective:   Chief complaint: This patient is seen in consultation for Gross Hematuria   Patient ID: Laurie Mcclure is a 57 y.o. female    Thank you for allowing us  to see Laurie Mcclure in consultation at the [facility].  I have personally reviewed the medical record, including office notes, laboratory results, imaging studies, and I have personally interviewed and examined the patient with the pertinent findings detailed below for our records.  History of Present Illness   Laurie Mcclure is a 57 y.o. female with a PMH that includes hypercholesterolemia  Today She reports chief complaint of Gross hematuria. She reports fullness of the bladder since she had recent episode gross hematuria.   The hematuria began in July and is described as one time episode. Pt denies dysuria. She urinates at normal frequency per day. She denies urgency. She denies the need to strain to void. She reports sensations of incomplete emptying. She denies abdominal pain. She denies flank pain. She denies fevers.   She denies prior history of gross hematuria.  She denies history of kidney stones.  She denies history of pyelonephritis.  She denies history of recent or recurrent UTI.  She denies history of GU malignancy or pelvic radiation.  She denies history of autoimmune disease.  She denies history of smoking  She denies known occupational risks.  She denies recent vigorous exercise which they think may be contributory to hematuria.  She denies any recent trauma or prolonged pressure to the perineal area.  She denies recent illness.  She denies taking anticoagulants.     Prior Imaging: CT Renal Stone Study on 11/19/22 at [facility]: FINDINGS:  Lower chest: No acute abnormality.   Hepatobiliary: No solid liver abnormality is seen. No gallstones,  gallbladder wall thickening, or biliary dilatation.   Pancreas: Unremarkable. No pancreatic ductal dilatation or  surrounding  inflammatory changes.   Spleen: Normal in size without significant abnormality.   Adrenals/Urinary Tract: Adrenal glands are unremarkable. Kidneys are  normal, without renal calculi, solid lesion, or hydronephrosis.  Bladder is unremarkable.   Stomach/Bowel: Stomach is within normal limits. Appendix appears  normal. No evidence of bowel wall thickening, distention, or  inflammatory changes.   Vascular/Lymphatic: Scattered aortic atherosclerosis. No enlarged  abdominal or pelvic lymph nodes.   Reproductive: No mass or other significant abnormality.   Other: No abdominal wall hernia or abnormality. No ascites.   Musculoskeletal: No acute or significant osseous findings.   IMPRESSION:  No non-contrast CT findings of the abdomen or pelvis to explain  hematuria. No evidence of urinary tract calculus or hydronephrosis.     UA today showed small blood. PVR: 5 mL    Past Medical History:  Diagnosis Date  . Acute nonintractable headache 03/05/2016  . 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 Been taking medication for the last two years.    . Pure hypercholesterolemia 11/27/2015   Past Surgical History:  Procedure Laterality Date  . SOFT TISSUE CYST EXCISION      Current Outpatient Medications:  .  Zetia  10 mg tablet, Take 1 tablet by mouth daily., Disp: , Rfl:  .  ibuprofen (MOTRIN) 200 mg tablet, Take 200 mg by mouth every 6 (six) hours as needed. (Patient not taking: Reported on 12/19/2022), Disp: , Rfl:    Review of Systems:   ROS was performed with pertinent positives/negatives noted in the HPI. The remainder of the ROS are negative.  Family History  Family History  Problem Relation Name Age of Onset  . Hyperlipidemia Mother         Objective:   Physical Exam  BP 136/75   Pulse 63   Resp 16   Ht 1.549 m (5' 1)   Wt 75.3 kg (166 lb)   SpO2 100%   BMI 31.37 kg/m   Body mass index is 31.37  kg/m.  Constitutional:  No acute distress Head: normocephalic, atraumatic Eyes: EOMI, sclera anicteric Ears/Nose/Mouth/Throat: No obvious drainage per ears, wearing mask Cardiovascular: Pulse regular Respiratory: No audible wheezing/stridor; respirations do not appear labored Gastrointestinal: Abdomen soft, non-tender, not distended Musculoskeletal: Normal ROM of UEs Skin: No obvious rashes/open sores Neurologic: Alert and oriented x 3 Psychiatric: Answered questions appropriately w/normal affect Hematologic/Lymphatic/Immunologic: No obvious bruises or sites of spontaneous bleeding Genitourinary: No CVA tenderness   Data Review as noted.  Results for orders placed or performed in visit on 12/19/22  Urinalysis without Microscopic  Result Value Ref Range   Color, Urine Yellow Yellow   Clarity, Urine Clear Clear   Specific Gravity, Urine 1.025 1.002 - 1.030   pH, Urine 6.0 5.0 - 8.0   Protein, Urine Negative Negative, Trace mg/dL   Glucose, Urine Negative Negative mg/dL   Ketones, Urine Negative Negative mg/dL   Bilirubin, Urine Negative Negative   Blood, Urine Small (A) Negative   Nitrite, Urine Negative Negative   Leukocyte Esterase, Urine Negative Negative   Urobilinogen, Urine 0.2 <2.0 mg/dL     Assessment:   Gross Hematuria For management of gross hematuria, we discussed possible etiologies including but not limited to: vigorous exercise, sexual activity, stone, trauma, blood thinner use, infection, kidney function, radiation cystitis, malignancy. We discussed the importance of work-up including assessing the lower GU tract cystoscopy. She had a CT scan done recently that was unremarkable. We will also check CMP, CBC, and voided cytology. Pt decided to pursue this work-up and follow-up afterward to discuss the results and formulate a treatment plan based on the findings. We discussed the risk for clot retention and pt was advised to increase fluid intake to thin out clots.  Pt was advised to go to the ER if they become unable to urinate due to clot retention. All questions were answered.     Patient Active Problem List  Diagnosis  (none) - all problems resolved or deleted     Plan:    Urine culture sent Voided cytology sent Schedule cystoscopy with Dr. Tommi  CMP and CBC sent    It has been explained that the patient is to follow regularly with their PCP in addition to all other providers involved in their care and to follow instructions provided by these respective offices. However, patient will contact the clinic if any urologic-pertaining questions, concerns, new symptoms or problems arise in the interim period.   Patient is agreeable to plan and all questions answered. After visit summary given to the patient or is available online via [facility] portal.   1. Hematuria, unspecified type  Urinalysis without Microscopic   Urinalysis without Microscopic   Surgical Urine Culture, Sterile Collection   Urinalysis, Microscopic Only   Surgical Urine Culture, Sterile Collection   Urinalysis, Microscopic Only

## 2022-12-19 DIAGNOSIS — R310 Gross hematuria: Secondary | ICD-10-CM | POA: Diagnosis not present

## 2022-12-19 DIAGNOSIS — R3914 Feeling of incomplete bladder emptying: Secondary | ICD-10-CM | POA: Diagnosis not present

## 2022-12-19 DIAGNOSIS — R319 Hematuria, unspecified: Secondary | ICD-10-CM | POA: Diagnosis not present

## 2022-12-23 ENCOUNTER — Ambulatory Visit
Admission: RE | Admit: 2022-12-23 | Discharge: 2022-12-23 | Disposition: A | Discharge status: Home / Self Care | Payer: BC Managed Care – PPO | Source: Ambulatory Visit | Attending: Family Medicine

## 2022-12-23 DIAGNOSIS — Z1231 Encounter for screening mammogram for malignant neoplasm of breast: Secondary | ICD-10-CM | POA: Diagnosis not present

## 2023-02-03 DIAGNOSIS — R3914 Feeling of incomplete bladder emptying: Secondary | ICD-10-CM | POA: Diagnosis not present

## 2023-02-03 DIAGNOSIS — R310 Gross hematuria: Secondary | ICD-10-CM | POA: Diagnosis not present

## 2023-02-03 NOTE — Progress Notes (Signed)
 Subjective:  12/19/2022: Our NP:  Chief complaint: This patient is seen in consultation for Gross Hematuria   Patient ID: Laurie Mcclure is a 57 y.o. female    rds.  History of Present Illness   Laurie Mcclure is a 57 y.o. female with a PMH that includes hypercholesterolemia  Today She reports chief complaint of Gross hematuria. She reports fullness of the bladder since she had recent episode gross hematuria.   The hematuria began in July and is described as one time episode. Pt denies dysuria. She urinates at normal frequency per day. She denies urgency. She denies the need to strain to void. She reports sensations of incomplete emptying. She denies abdominal pain. She denies flank pain. She denies fevers.   She denies prior history of gross hematuria.  She denies history of kidney stones.  She denies history of pyelonephritis.  She denies history of recent or recurrent UTI.  She denies history of GU malignancy or pelvic radiation.  She denies history of autoimmune disease.  She denies history of smoking  She denies known occupational risks.  She denies recent vigorous exercise which they think may be contributory to hematuria.  She denies any recent trauma or prolonged pressure to the perineal area.  She denies recent illness.  She denies taking anticoagulants.     Prior Imaging: CT Renal Stone Study on 11/19/22 at [facility]: FINDINGS:  Lower chest: No acute abnormality.   Hepatobiliary: No solid liver abnormality is seen. No gallstones,  gallbladder wall thickening, or biliary dilatation.   Pancreas: Unremarkable. No pancreatic ductal dilatation or  surrounding inflammatory changes.   Spleen: Normal in size without significant abnormality.   Adrenals/Urinary Tract: Adrenal glands are unremarkable. Kidneys are  normal, without renal calculi, solid lesion, or hydronephrosis.  Bladder is unremarkable.   Stomach/Bowel: Stomach is within normal limits. Appendix appears   normal. No evidence of bowel wall thickening, distention, or  inflammatory changes.   Vascular/Lymphatic: Scattered aortic atherosclerosis. No enlarged  abdominal or pelvic lymph nodes.   Reproductive: No mass or other significant abnormality.   Other: No abdominal wall hernia or abnormality. No ascites.   Musculoskeletal: No acute or significant osseous findings.   IMPRESSION:  No non-contrast CT findings of the abdomen or pelvis to explain  hematuria. No evidence of urinary tract calculus or hydronephrosis.     UA today showed small blood. PVR: 5 mL    Past Medical History:  Diagnosis Date  . Acute nonintractable headache 03/05/2016  . 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 Been taking medication for the last two years.    . Pure hypercholesterolemia 11/27/2015   Past Surgical History:  Procedure Laterality Date  . SOFT TISSUE CYST EXCISION      Current Outpatient Medications:  .  Zetia  10 mg tablet, Take 1 tablet by mouth daily., Disp: , Rfl:  No current facility-administered medications for this visit.   Review of Systems:   ROS was performed with pertinent positives/negatives noted in the HPI. The remainder of the ROS are negative.  Family History  Family History  Problem Relation Name Age of Onset  . Hyperlipidemia Mother         Objective:   Physical Exam  BP 148/72   Pulse 60   Temp 97.5 F (36.4 C) (Temporal)   Resp 16   Ht 1.562 m (5' 1.5)   Wt 75.3 kg (166 lb)   SpO2 99%   BMI 30.86 kg/m  Body mass index is 30.86 kg/m.  Constitutional:  No acute distress Head: normocephalic, atraumatic Eyes: EOMI, sclera anicteric Ears/Nose/Mouth/Throat: No obvious drainage per ears, wearing mask Cardiovascular: Pulse regular Respiratory: No audible wheezing/stridor; respirations do not appear labored Gastrointestinal: Abdomen soft, non-tender, not distended Musculoskeletal: Normal ROM of  UEs Skin: No obvious rashes/open sores Neurologic: Alert and oriented x 3 Psychiatric: Answered questions appropriately w/normal affect Hematologic/Lymphatic/Immunologic: No obvious bruises or sites of spontaneous bleeding Genitourinary: No CVA tenderness   Data Review as noted.  Results for orders placed or performed in visit on 12/19/22  Urinalysis without Microscopic   Collection Time: 12/19/22  1:55 PM  Result Value Ref Range   Color, Urine Yellow Yellow   Clarity, Urine Clear Clear   Specific Gravity, Urine 1.025 1.002 - 1.030   pH, Urine 6.0 5.0 - 8.0   Protein, Urine Negative Negative, Trace mg/dL   Glucose, Urine Negative Negative mg/dL   Ketones, Urine Negative Negative mg/dL   Bilirubin, Urine Negative Negative   Blood, Urine Small (A) Negative   Nitrite, Urine Negative Negative   Leukocyte Esterase, Urine Negative Negative   Urobilinogen, Urine 0.2 <2.0 mg/dL  Surgical Urine Culture, Sterile Collection   Collection Time: 12/19/22  2:02 PM   Specimen: Clean Catch; Urine  Result Value Ref Range   Urine Culture,Surgical No uropathogens isolated.   Urinalysis, Microscopic Only   Collection Time: 12/19/22  2:02 PM  Result Value Ref Range   WBC, Urine None Seen <3 /HPF   RBC, Urine 0-2 <3 /HPF   Bacteria, Urine 1-5 (A) None Seen /HPF  Non-Gynecologic Cytology   Collection Time: 12/19/22  2:33 PM  Result Value Ref Range   Case Report      Non-Gynecologic Cytology Report                   Case: [id number]                              Authorizing Provider:  Oluwatoyin Alaba Fadeyi,   Collected:           12/19/2022 1433                                    DNP                                                                         Ordering Location:     [facility]  Received:            12/19/2022 1433                                    [facility]  - Urology  [place]                                                                     Pathologist:           Jon Ronnald Nephew, MD                                                    Specimen:    Voided                                                                                   Final Diagnosis      A. URINE, VOIDED (THINPREP): Negative for high grade urothelial carcinoma.    Clinical Information      Gross hematuria    Gross Description      A. Urine: Voided 60 cc of yellow fluid including ThinPrep CytoLytT.  No sediment. 1 liquid based slide prepared.      Attestation Statement      I verify that I have personally reviewed all relevant slides/materials for this case and rendered or confirmed the diagnosis.   Comprehensive Metabolic Panel   Collection Time: 12/19/22  2:44 PM  Result Value Ref Range   Sodium 139 136 - 145 mmol/L   Potassium 4.5 3.5 - 5.1 mmol/L   Chloride 101 98 - 107 mmol/L   CO2 28 21 - 31 mmol/L   Anion Gap 10 6 - 14 mmol/L   Glucose, Random 83 70 - 99 mg/dL   Blood Urea Nitrogen (BUN) 22 7 - 25 mg/dL   Creatinine 9.15 9.39 - 1.20 mg/dL   eGFR 81 >40 fO/fpw/8.26f7   Albumin 4.7 3.5 - 5.7 g/dL   Total Protein 7.4 6.4 - 8.9 g/dL   Bilirubin, Total 0.5 0.3 - 1.0 mg/dL   Alkaline Phosphatase (ALP) 62 34 - 104 U/L   Aspartate Aminotransferase (AST) 15 13 - 39 U/L   Alanine Aminotransferase (ALT) 17 7 - 52 U/L   Calcium 9.9 8.6 - 10.3 mg/dL   BUN/Creatinine Ratio    CBC with Differential   Collection Time: 12/19/22  2:44 PM  Result Value Ref Range   WBC 8.30 4.40 - 11.00 10*3/uL   RBC 4.43 4.10 - 5.10 10*6/uL   Hemoglobin 13.2 12.3 - 15.3 g/dL   Hematocrit 61.3 64.0 - 44.6 %   Mean Corpuscular Volume (MCV) 87.1 80.0 - 96.0 fL   Mean Corpuscular Hemoglobin (MCH) 29.8 27.5 - 33.2 pg   Mean Corpuscular Hemoglobin Conc (MCHC) 34.2 33.0 - 37.0 g/dL   Red Cell Distribution Width (RDW) 15.3 12.3 - 17.0 %   Platelet Count (PLT) 254 150 - 450 10*3/uL   Mean Platelet Volume (MPV) 9.5  6.8 - 10.2 fL   Neutrophils % 64 %   Lymphocytes %  25 %   Monocytes % 9 %   Eosinophils % 2 %   Basophils % 0 %   nRBC % 0 %   Neutrophils Absolute 5.30 1.80 - 7.80 10*3/uL   Lymphocytes # 2.10 1.00 - 4.80 10*3/uL   Monocytes # 0.70 0.00 - 0.80 10*3/uL   Eosinophils # 0.10 0.00 - 0.50 10*3/uL   Basophils # 0.00 0.00 - 0.20 10*3/uL   nRBC Absolute 0.00 <=0.00 10*3/uL     Assessment:   Gross Hematuria For management of gross hematuria, we discussed possible etiologies including but not limited to: vigorous exercise, sexual activity, stone, trauma, blood thinner use, infection, kidney function, radiation cystitis, malignancy. We discussed the importance of work-up including assessing the lower GU tract cystoscopy. She had a CT scan done recently that was unremarkable. We will also check CMP, CBC, and voided cytology. Pt decided to pursue this work-up and follow-up afterward to discuss the results and formulate a treatment plan based on the findings. We discussed the risk for clot retention and pt was advised to increase fluid intake to thin out clots. Pt was advised to go to the ER if they become unable to urinate due to clot retention. All questions were answered.     Patient Active Problem List  Diagnosis  (none) - all problems resolved or deleted     Plan:    Urine culture sent Voided cytology sent Schedule cystoscopy with Dr. Tommi  CMP and CBC sent      1. Gross hematuria  ciprofloxacin (CIPRO) tablet 500 mg      02/03/2023: Cystoscopy Verbal consent obtained The patient's perineum was prepped with betadine and draped in the usual sterile fashion, with patient in the lithotomy position. The patient was given a prophylactic treatment before the procedure. 2% Xylocaine  jelly was instilled in the urethra. A cystoscopy was then carried out using a flexible cystoscope, and the urethra was inspected using direct vision. The bladder was entered and pandescoped. The scope was  removed without incident.   Findings Bladder: Bladder is normal with no lesions, tumors or diverticuli.  Bladder Comments. . No mass. No mucosal lesion Ureteral orifices: Both ureteral orifices were in their normal anatomic position and appearance.    Urethra: Urethra is normal with no evidence of stricture.  General Findings Comments:   Complete normal bladder. Patient with history of 1 day of gross hematuria with pain more than 3 months ago.  She has gone to [facility] and a CT without contrast has been done that was normal.  CT was 1 months after episodes.  She never had hematuria prior.  She never had hematuria after 1 day.  She has never had a stone.  Pain has been very significant at that time.  She has been given an antibiotic but urine has not been sent for culture.  No symptoms at this point. Urine cytology is negative   Plan: CT urogram If CT urogram will be normal just 1 visit in 1 year with renal ultrasound If she develops gross hematuria again will come back earlier

## 2023-02-03 NOTE — Progress Notes (Signed)
  Diagnostic Cystoscopy  Performed by: Rhae Loges, MD Authorized by: Rhae Loges, MD

## 2023-05-12 ENCOUNTER — Other Ambulatory Visit: Payer: Self-pay | Admitting: Cardiovascular Disease

## 2023-05-18 ENCOUNTER — Other Ambulatory Visit (HOSPITAL_COMMUNITY): Payer: Self-pay

## 2023-05-18 MED FILL — Ezetimibe Tab 10 MG: ORAL | 90 days supply | Qty: 90 | Fill #0 | Status: AC

## 2023-06-01 ENCOUNTER — Other Ambulatory Visit: Payer: Self-pay | Admitting: Family Medicine

## 2023-06-01 ENCOUNTER — Other Ambulatory Visit (HOSPITAL_COMMUNITY)
Admission: RE | Admit: 2023-06-01 | Discharge: 2023-06-01 | Disposition: A | Discharge status: Home / Self Care | Payer: 59 | Source: Ambulatory Visit | Attending: Family Medicine | Admitting: Family Medicine

## 2023-06-01 DIAGNOSIS — E7800 Pure hypercholesterolemia, unspecified: Secondary | ICD-10-CM | POA: Diagnosis not present

## 2023-06-01 DIAGNOSIS — Z124 Encounter for screening for malignant neoplasm of cervix: Secondary | ICD-10-CM | POA: Diagnosis not present

## 2023-06-01 DIAGNOSIS — Z0000 Encounter for general adult medical examination without abnormal findings: Secondary | ICD-10-CM | POA: Diagnosis not present

## 2023-06-01 DIAGNOSIS — Z01411 Encounter for gynecological examination (general) (routine) with abnormal findings: Secondary | ICD-10-CM | POA: Diagnosis not present

## 2023-06-01 DIAGNOSIS — Z79899 Other long term (current) drug therapy: Secondary | ICD-10-CM | POA: Diagnosis not present

## 2023-06-02 LAB — CYTOLOGY - PAP
Comment: NEGATIVE
Diagnosis: NEGATIVE
High risk HPV: NEGATIVE

## 2023-06-08 IMAGING — CT CT CARDIAC CORONARY ARTERY CALCIUM SCORE
2 of 3 series · 10 of 20 positions shown, 12 images · non-contrast
Comparison: None.
COMPARISON: None.

Addendum:
EXAM:
OVER-READ INTERPRETATION  CT CHEST

The following report is an over-read performed by radiologist Dr.
Sumitra Odriscoll [REDACTED] on 05/29/2021. This over-read
does not include interpretation of cardiac or coronary anatomy or
pathology. The coronary calcium score interpretation by the
cardiologist is attached.
CLINICAL DATA: Cardiovascular Disease Risk stratification
Coronary Calcium Score
TECHNIQUE: A gated, non-contrast computed tomography scan of the heart was
performed using 3mm slice thickness. Axial images were analyzed on a
dedicated workstation. Calcium scoring of the coronary arteries was
performed using the Agatston method.

[Series 3: 2 soft full fov · axial · 0.74mm/px · z∈[-233,-146]mm · 5 of 45 slices shown, 7 images]
[im 8/45  vessel]
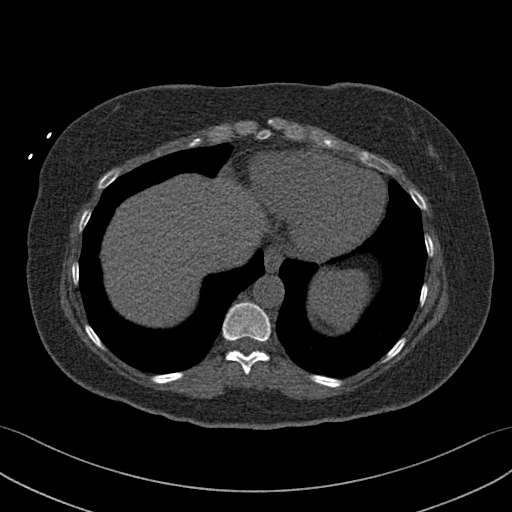
[im 8/45  lung]
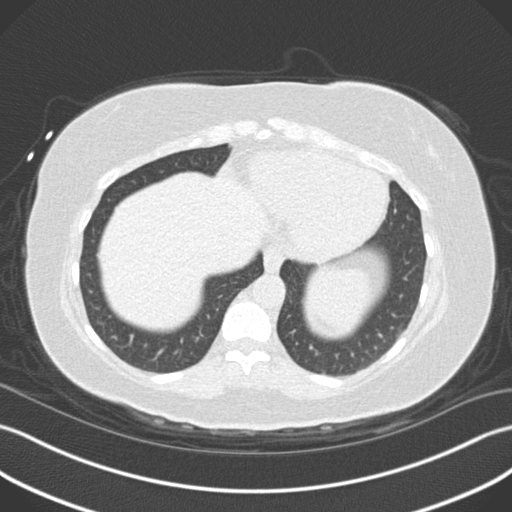
[im 15/45  vessel]
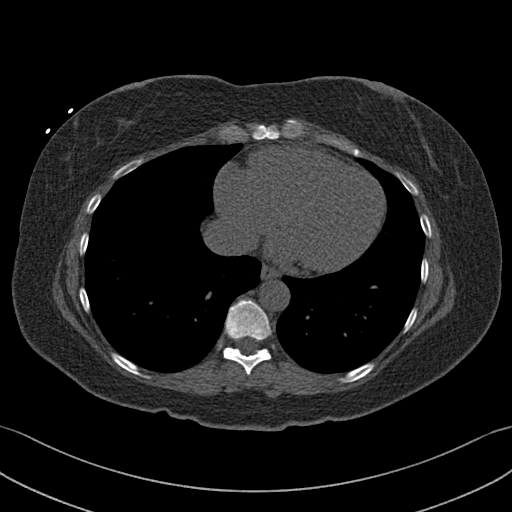
[im 23/45  vessel]
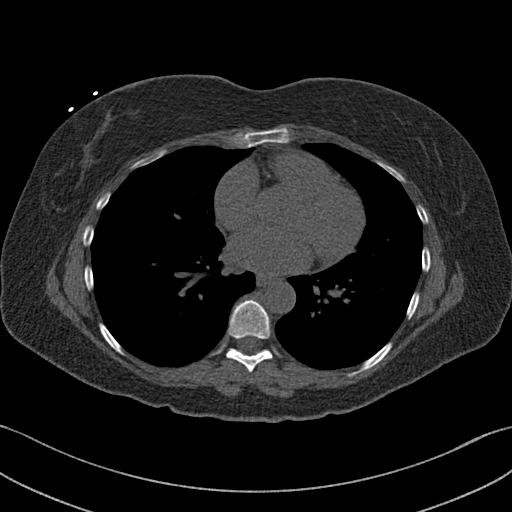
[im 30/45  vessel]
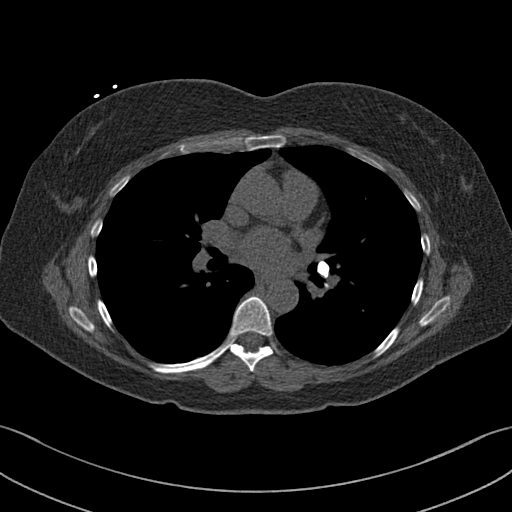
[im 37/45  vessel]
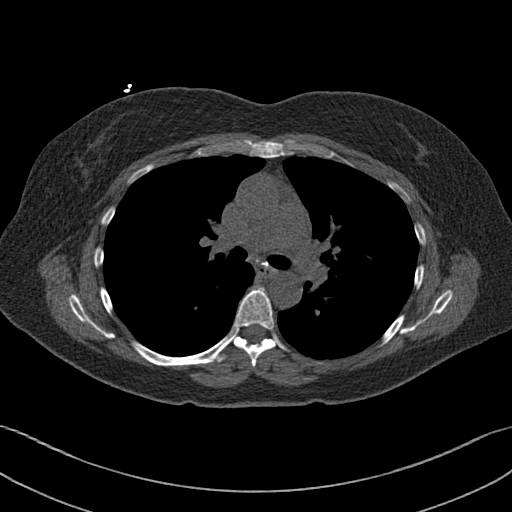
[im 37/45  lung]
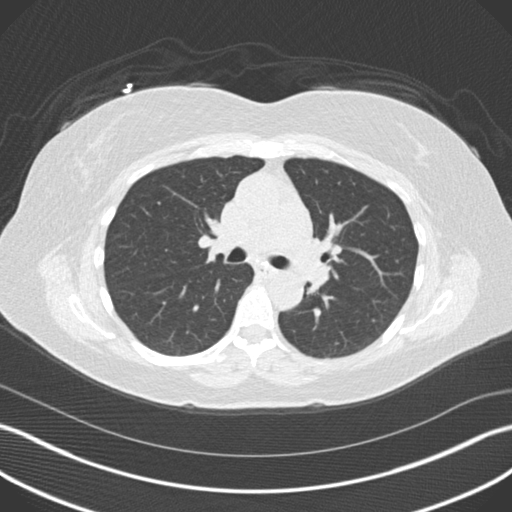

[Series 5: lungs · axial · 0.74mm/px · z∈[-233,-146]mm · 5 of 45 slices shown]
[im 8/45  vessel]
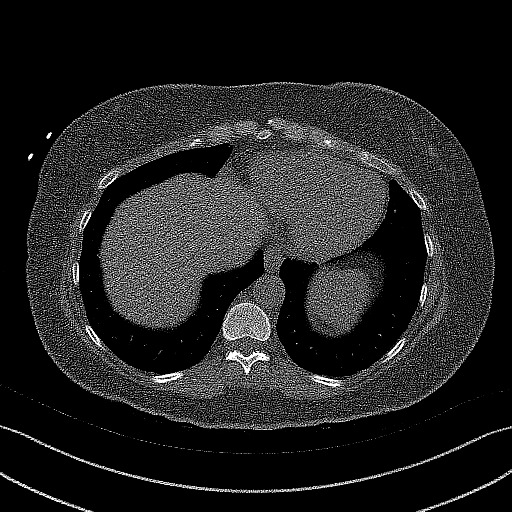
[im 15/45  vessel]
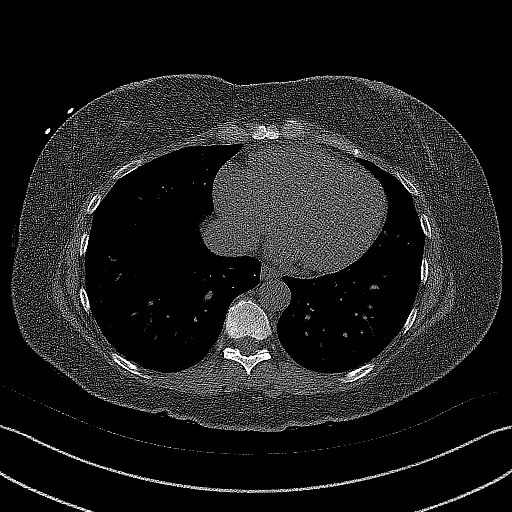
[im 23/45  vessel]
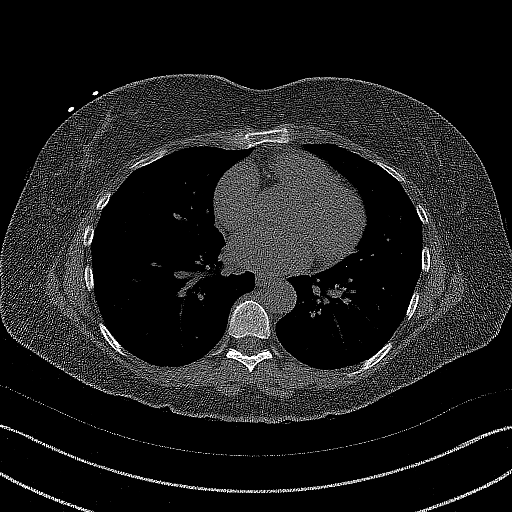
[im 30/45  vessel]
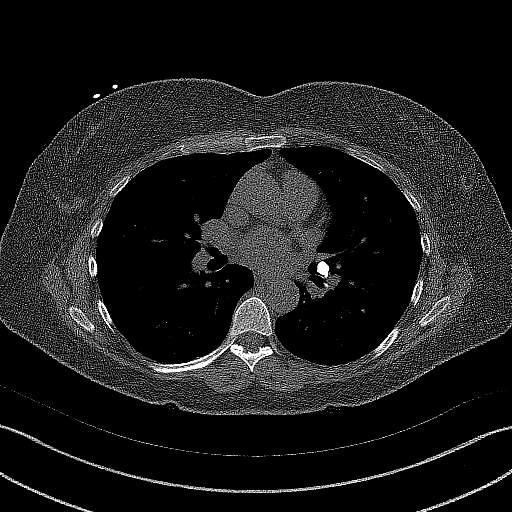
[im 37/45  vessel]
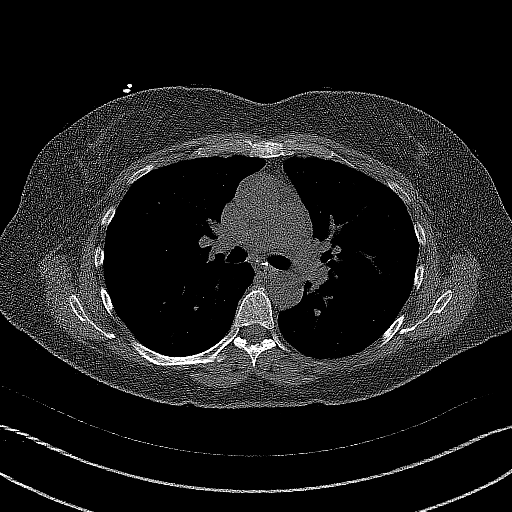

[10 of 20 positions shown; findings below may reference images not displayed]

FINDINGS: Vascular: Heart is normal size. Aorta normal caliber. Scattered
calcifications in the aortic arch.

Mediastinum/Nodes: Calcified left hilar and mediastinal lymph nodes.
No adenopathy

Lungs/Pleura: No confluent opacity or effusion. Calcified granuloma
at the left lung base.

Upper Abdomen: No acute findings

Musculoskeletal: Chest wall soft tissues are unremarkable. No acute
bony abnormality.
IMPRESSION: Old granulomatous disease.

Scattered aortic calcifications.

No acute extra cardiac abnormality.
FINDINGS: Coronary arteries: Normal origins.

Coronary Calcium Score:

Left main: 0

Left anterior descending artery:

Left circumflex artery:

Right coronary artery:

Total: 113

Percentile: 95th

Pericardium: Normal.

Ascending Aorta: Normal caliber.

Non-cardiac: See separate report from [REDACTED].
IMPRESSION: Coronary calcium score of 113 Agatston units. This was 95th
percentile for age-, race-, and sex-matched controls.



If CAC=0, it is reasonable to withhold statin therapy and reassess
in 5 to 10 years, as long as higher risk conditions are absent
(diabetes mellitus, family history of premature CHD in first degree
relatives (males <55 years; females <65 years), cigarette smoking,
or LDL >=190 mg/dL).

If CAC is 1 to 99, it is reasonable to initiate statin therapy for
patients >=55 years of age.

If CAC is >=100 or >=75th percentile, it is reasonable to initiate
statin therapy at any age.

Cardiology referral should be considered for patients with CAC
scores >=400 or >=75th percentile.

*0221 AHA/ACC/AACVPR/AAPA/ABC/ROBERTO/NCAAGAE/BATOOL/Amnon/CHOMBONG/AUJLA/NAZARETH
Guideline on the Management of Blood Cholesterol: A Report of the
American College of Cardiology/American Heart Association Task Force
on Clinical Practice Guidelines. J Am Coll Cardiol.
3568;73(24):1012-1266.

*** End of Addendum ***
EXAM:
OVER-READ INTERPRETATION  CT CHEST

The following report is an over-read performed by radiologist Dr.
Sumitra Odriscoll [REDACTED] on 05/29/2021. This over-read
does not include interpretation of cardiac or coronary anatomy or
pathology. The coronary calcium score interpretation by the
cardiologist is attached.
FINDINGS: Vascular: Heart is normal size. Aorta normal caliber. Scattered
calcifications in the aortic arch.

Mediastinum/Nodes: Calcified left hilar and mediastinal lymph nodes.
No adenopathy

Lungs/Pleura: No confluent opacity or effusion. Calcified granuloma
at the left lung base.

Upper Abdomen: No acute findings

Musculoskeletal: Chest wall soft tissues are unremarkable. No acute
bony abnormality.
IMPRESSION: Old granulomatous disease.

Scattered aortic calcifications.

No acute extra cardiac abnormality.

## 2023-07-02 ENCOUNTER — Telehealth: Payer: Self-pay

## 2023-07-02 ENCOUNTER — Other Ambulatory Visit (HOSPITAL_COMMUNITY): Payer: Self-pay

## 2023-07-02 ENCOUNTER — Telehealth: Payer: Self-pay | Admitting: Pharmacy Technician

## 2023-07-02 NOTE — Telephone Encounter (Signed)
 Pharmacy Patient Advocate Encounter   Received notification from Pt Calls Messages that prior authorization for REPATHA is required/requested.   Insurance verification completed.   The patient is insured through [facility] .   Per test claim: PA required; PA submitted to above mentioned insurance via CoverMyMeds Key/confirmation #/EOC [facility] Status is pending

## 2023-07-02 NOTE — Telephone Encounter (Signed)
 Patient identification verified by 2 forms. Shade Flood, RN    Patient walked into clinic today  Patient states:  -Patient now has [employer]. Patient was to start Repatha but previous insurance would not cover the med.  - Patient would like to try and restart repatha at this time.               Interventions/Plan: - Encounter forwarded to primary cardiologist for review.   Patient agrees with plan, no questions at this time

## 2023-07-02 NOTE — Telephone Encounter (Signed)
 Please see if current insurance will cover Repatha 140 mg Q2weeks

## 2023-07-03 ENCOUNTER — Other Ambulatory Visit (HOSPITAL_COMMUNITY): Payer: Self-pay

## 2023-07-03 NOTE — Telephone Encounter (Signed)
 Pharmacy Patient Advocate Encounter  Received notification from [facility] that Prior Authorization for repatha has been APPROVED from 07/03/23 to 07/01/24. Ran test claim, Copay is $24.99. This test claim was processed through [facility]- copay amounts may vary at other pharmacies due to pharmacy/plan contracts, or as the patient moves through the different stages of their insurance plan.   PA #/Case ID/Reference #: [id number]

## 2023-07-07 ENCOUNTER — Other Ambulatory Visit (HOSPITAL_COMMUNITY): Payer: Self-pay

## 2023-07-07 MED ORDER — REPATHA SURECLICK 140 MG/ML ~~LOC~~ SOAJ
140.0000 mg | SUBCUTANEOUS | 3 refills | Status: AC
  Filled 2023-07-07: qty 6, 84d supply, fill #0
  Filled 2023-09-24: qty 6, 84d supply, fill #1
  Filled 2023-12-17: qty 6, 84d supply, fill #2
  Filled 2024-03-11: qty 6, 84d supply, fill #3

## 2023-07-07 NOTE — Telephone Encounter (Signed)
 Repatha PA approved see other encounter.

## 2023-07-07 NOTE — Addendum Note (Signed)
 Addended by: Tylene Fantasia on: 07/07/2023 10:59 AM   Modules accepted: Orders

## 2023-07-08 ENCOUNTER — Other Ambulatory Visit (HOSPITAL_COMMUNITY): Payer: Self-pay

## 2023-07-08 ENCOUNTER — Telehealth: Payer: Self-pay | Admitting: Cardiovascular Disease

## 2023-07-08 DIAGNOSIS — Z79899 Other long term (current) drug therapy: Secondary | ICD-10-CM

## 2023-07-08 NOTE — Telephone Encounter (Signed)
  Pt c/o medication issue:  1. Name of Medication: Evolocumab (REPATHA SURECLICK) 140 MG/ML SOAJ   2. How are you currently taking this medication (dosage and times per day)? As written  3. Are you having a reaction (difficulty breathing--STAT)? No   4. What is your medication issue? The patient said she is ready to start taking this medication. She wants to know if she also needs to continue taking her Zetia and if any lab work is required after starting these medications. If so, she would like to know when she should get the labs done.

## 2023-07-08 NOTE — Telephone Encounter (Signed)
 Patient identification verified by 2 forms. Shade Flood, RN     Called and spoke to patient  Patient states:  - She would like to start REPATHA now.  - Wants to know when she will need to come in for labwork after starting repatha.   Patient denies:              Interventions/Plan: - Encounter forwarded to primary cardiologist for review.   Patient agrees with plan, no questions at this time

## 2023-07-13 ENCOUNTER — Other Ambulatory Visit (HOSPITAL_COMMUNITY): Payer: Self-pay

## 2023-07-14 NOTE — Telephone Encounter (Signed)
 Patient identification verified by 2 forms. Laurie Rail, RN    Called and spoke to patient  Relayed provider message  Patient states:   -would like to know if repeat labs needed since starting Repatha   -she has Lipid panel done with PCP 05/2022 Informed patient message sent  Patient had no further questions at this time

## 2023-07-14 NOTE — Telephone Encounter (Signed)
 Patient identification verified by 2 forms. Marilynn Rail, RN    Called and spoke to patient  Relayed provider message  Informed patient lab orders placed  Patient verbalized understanding, no questions at this time

## 2023-07-14 NOTE — Telephone Encounter (Signed)
 Pt calling back to f/u on call from 4/1 regarding whether she is continue taking Zetia being that she is now starting Repatha. Please advise

## 2023-07-14 NOTE — Telephone Encounter (Signed)
 We should stop the Zetia.

## 2023-07-14 NOTE — Telephone Encounter (Signed)
 After at least 2 months of Repatha. Sometime in June

## 2023-08-25 IMAGING — DX DG HAND COMPLETE 3+V*R*
3 series · 3 of 3 positions shown · non-contrast
Comparison: None Available.

CLINICAL DATA: Fall, right fourth digit pain

EXAM:
RIGHT HAND - COMPLETE 3+ VIEW

[hand pa]
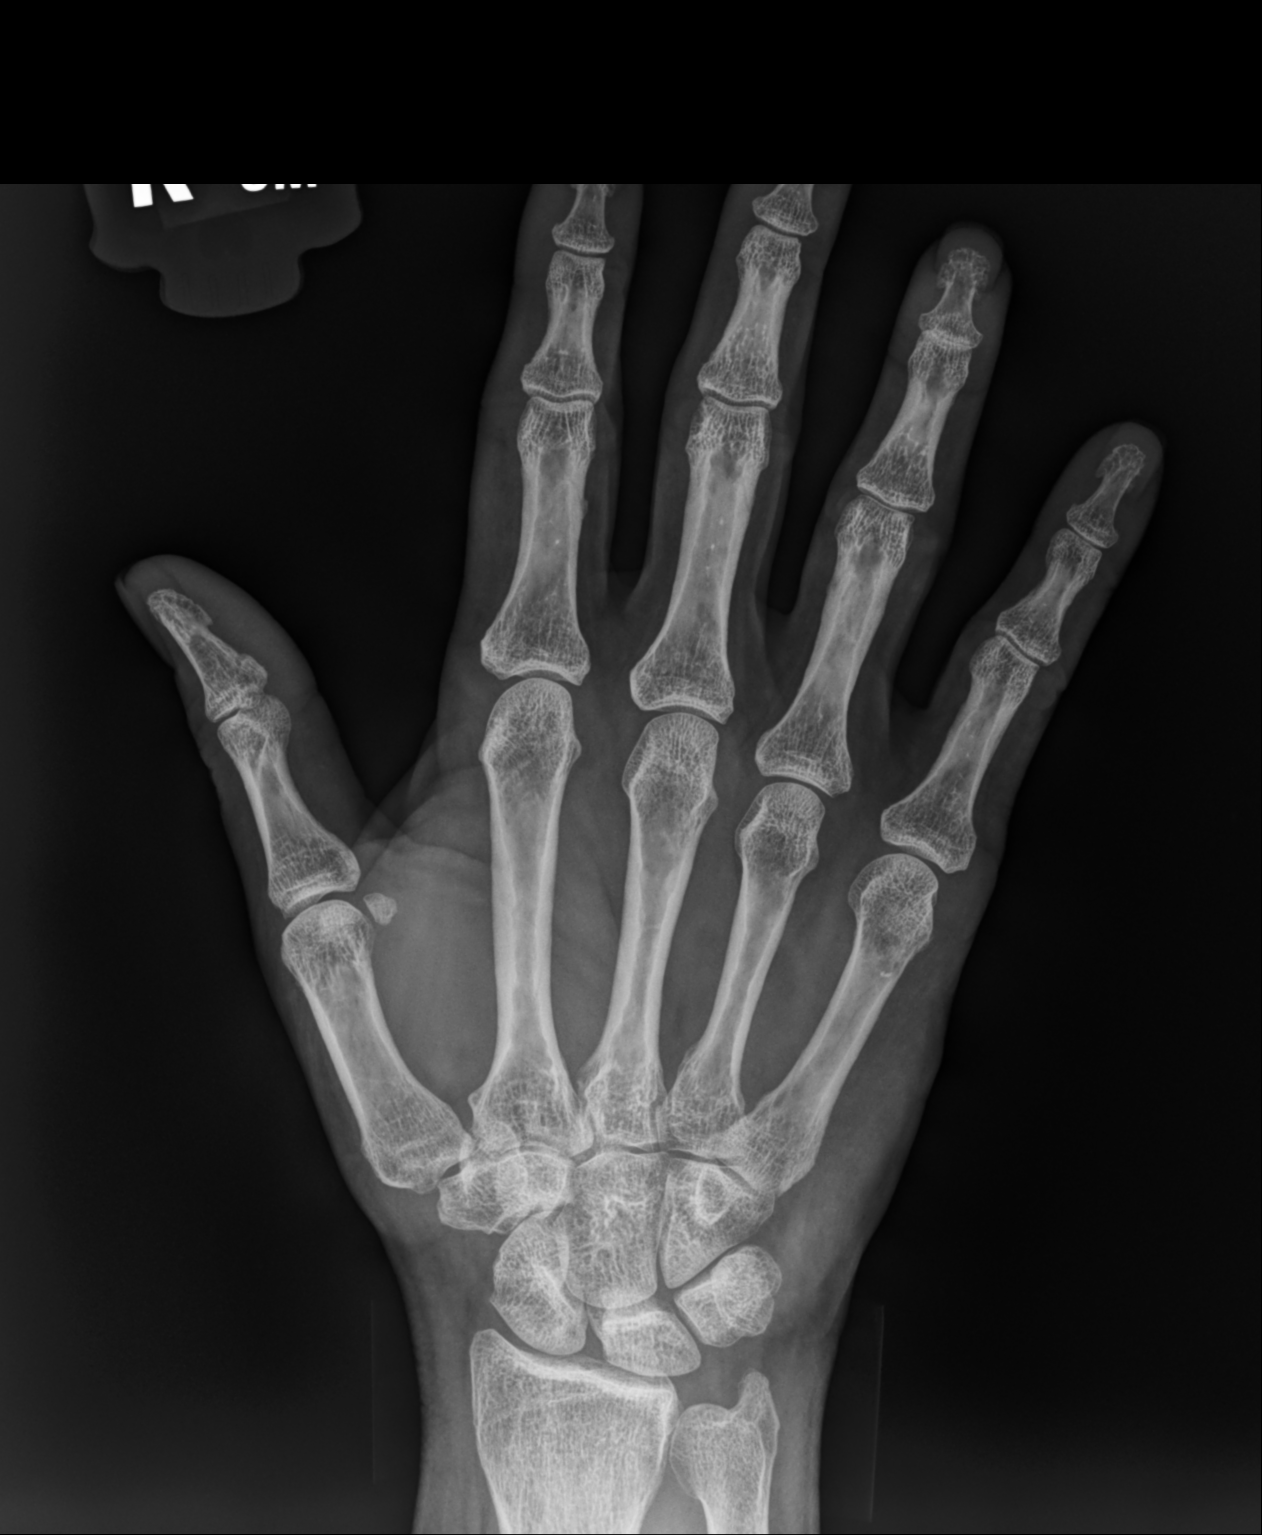

[hand mlo]
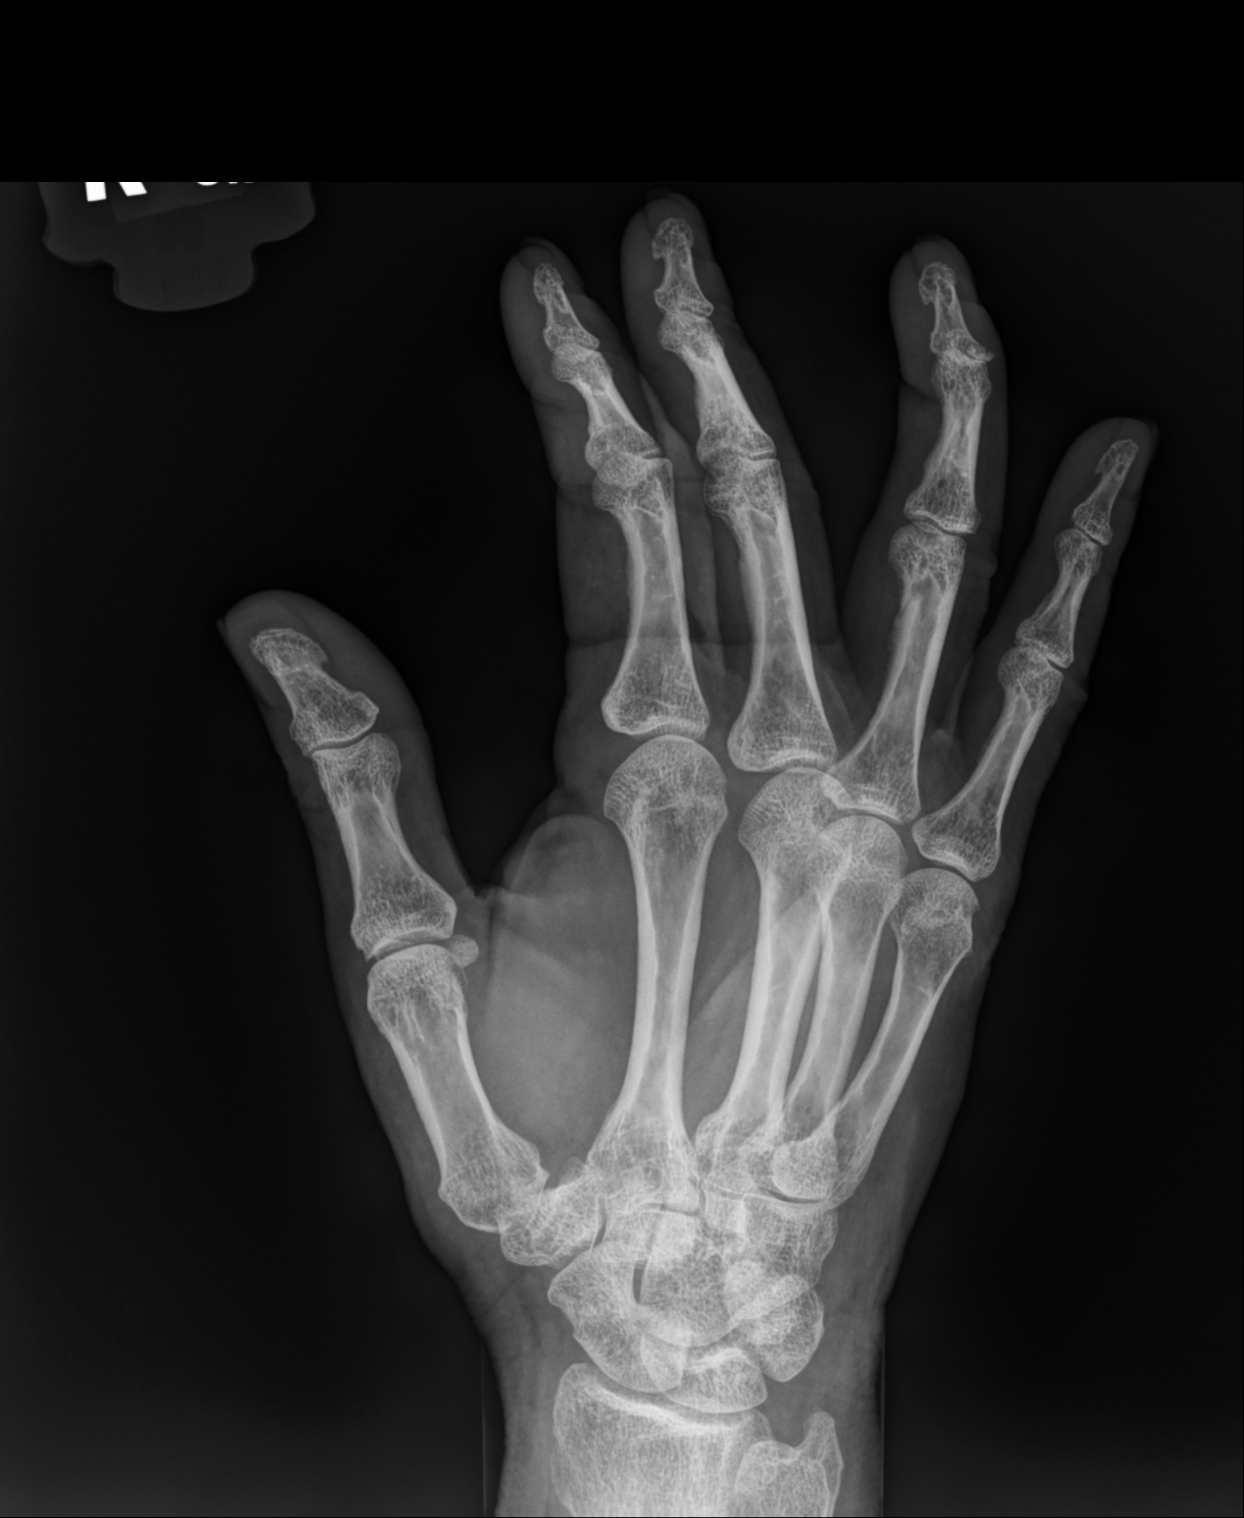

[hand lat]
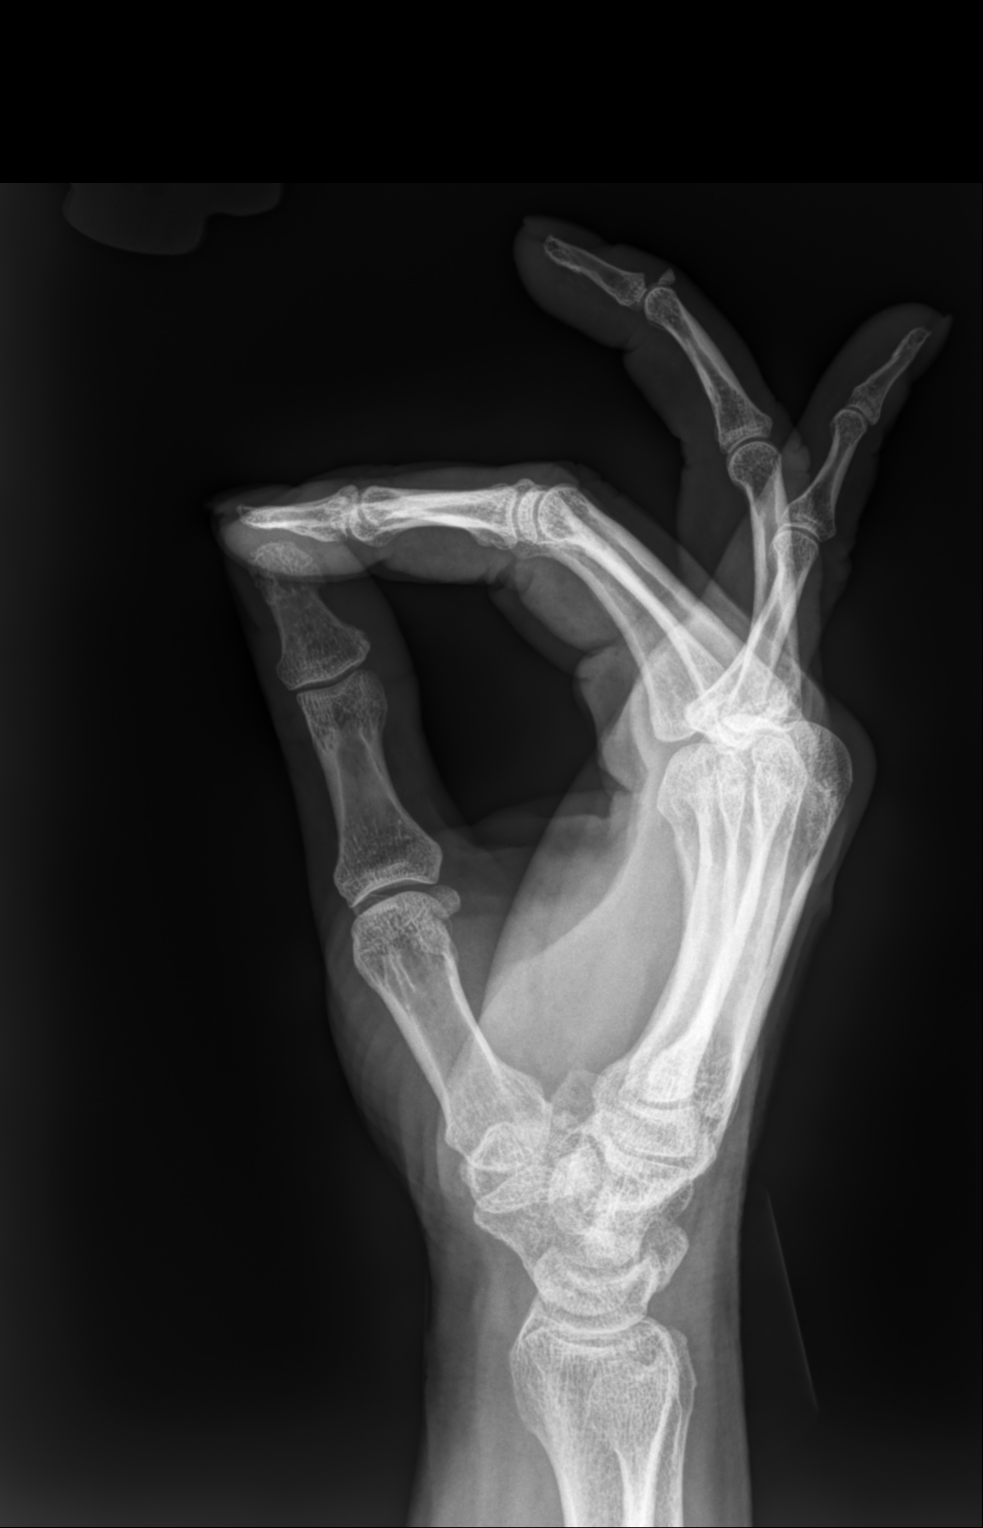

[3 of 3 positions shown; findings below may reference images not displayed]

FINDINGS: Displaced intra-articular corner type fracture of the dorsal right
fourth distal phalanx. No other fracture or dislocation of the right
hand. Joint spaces are well preserved.
IMPRESSION: Displaced intra-articular corner type fracture of the dorsal right
fourth distal phalanx. No other fracture or dislocation of the right
hand.

## 2023-09-24 ENCOUNTER — Other Ambulatory Visit: Payer: Self-pay

## 2023-09-24 ENCOUNTER — Other Ambulatory Visit (HOSPITAL_COMMUNITY): Payer: Self-pay

## 2023-11-09 DIAGNOSIS — D485 Neoplasm of uncertain behavior of skin: Secondary | ICD-10-CM | POA: Diagnosis not present

## 2023-11-09 DIAGNOSIS — C44311 Basal cell carcinoma of skin of nose: Secondary | ICD-10-CM | POA: Diagnosis not present

## 2023-11-16 DIAGNOSIS — C44311 Basal cell carcinoma of skin of nose: Secondary | ICD-10-CM | POA: Diagnosis not present

## 2023-12-14 LAB — LIPID PANEL
Chol/HDL Ratio: 2.3 ratio (ref 0.0–4.4)
Cholesterol, Total: 167 mg/dL (ref 100–199)
HDL: 72 mg/dL
LDL Chol Calc (NIH): 75 mg/dL (ref 0–99)
Triglycerides: 112 mg/dL (ref 0–149)
VLDL Cholesterol Cal: 20 mg/dL (ref 5–40)

## 2023-12-15 ENCOUNTER — Ambulatory Visit: Payer: Self-pay | Admitting: Cardiovascular Disease

## 2023-12-17 ENCOUNTER — Other Ambulatory Visit (HOSPITAL_COMMUNITY): Payer: Self-pay

## 2024-02-02 ENCOUNTER — Other Ambulatory Visit (HOSPITAL_COMMUNITY): Payer: Self-pay

## 2024-02-10 DIAGNOSIS — D225 Melanocytic nevi of trunk: Secondary | ICD-10-CM | POA: Diagnosis not present

## 2024-02-10 DIAGNOSIS — L578 Other skin changes due to chronic exposure to nonionizing radiation: Secondary | ICD-10-CM | POA: Diagnosis not present

## 2024-02-10 DIAGNOSIS — L570 Actinic keratosis: Secondary | ICD-10-CM | POA: Diagnosis not present

## 2024-02-10 DIAGNOSIS — L821 Other seborrheic keratosis: Secondary | ICD-10-CM | POA: Diagnosis not present

## 2024-02-10 DIAGNOSIS — L814 Other melanin hyperpigmentation: Secondary | ICD-10-CM | POA: Diagnosis not present

## 2024-03-11 ENCOUNTER — Other Ambulatory Visit (HOSPITAL_COMMUNITY): Payer: Self-pay

## 2024-03-11 ENCOUNTER — Other Ambulatory Visit: Payer: Self-pay

## 2024-03-14 ENCOUNTER — Other Ambulatory Visit (HOSPITAL_COMMUNITY): Payer: Self-pay

## 2024-04-22 ENCOUNTER — Other Ambulatory Visit (HOSPITAL_COMMUNITY): Payer: Self-pay

## 2024-04-22 MED ORDER — AMOXICILLIN-POT CLAVULANATE 875-125 MG PO TABS
1.0000 | ORAL_TABLET | Freq: Two times a day (BID) | ORAL | 0 refills | Status: AC
  Filled 2024-04-22: qty 20, 10d supply, fill #0
